# Patient Record
Sex: Male | Born: 1965 | Race: Black or African American | Hispanic: No | Marital: Married | Smoking: Never smoker
Health system: Southern US, Community
[De-identification: ages and names within clinical notes are randomized; demographics above are authoritative.]

## PROBLEM LIST (undated history)

## (undated) DIAGNOSIS — E119 Type 2 diabetes mellitus without complications: Secondary | ICD-10-CM

## (undated) DIAGNOSIS — M109 Gout, unspecified: Secondary | ICD-10-CM

## (undated) HISTORY — DX: Type 2 diabetes mellitus without complications: E11.9

## (undated) HISTORY — PX: OTHER SURGICAL HISTORY: SHX169

---

## 2005-07-26 ENCOUNTER — Emergency Department (HOSPITAL_COMMUNITY): Admission: EM | Admit: 2005-07-26 | Discharge: 2005-07-26 | Payer: Self-pay | Admitting: Emergency Medicine

## 2005-07-30 ENCOUNTER — Emergency Department (HOSPITAL_COMMUNITY): Admission: EM | Admit: 2005-07-30 | Discharge: 2005-07-30 | Payer: Self-pay | Admitting: Emergency Medicine

## 2005-08-02 ENCOUNTER — Emergency Department (HOSPITAL_COMMUNITY): Admission: EM | Admit: 2005-08-02 | Discharge: 2005-08-02 | Payer: Self-pay | Admitting: Emergency Medicine

## 2005-12-09 ENCOUNTER — Ambulatory Visit (HOSPITAL_COMMUNITY): Admission: RE | Admit: 2005-12-09 | Discharge: 2005-12-09 | Payer: Self-pay | Admitting: Internal Medicine

## 2007-07-18 ENCOUNTER — Ambulatory Visit (HOSPITAL_COMMUNITY): Admission: RE | Admit: 2007-07-18 | Discharge: 2007-07-18 | Payer: Self-pay | Admitting: Internal Medicine

## 2008-09-20 ENCOUNTER — Emergency Department (HOSPITAL_COMMUNITY): Admission: EM | Admit: 2008-09-20 | Discharge: 2008-09-20 | Payer: Self-pay | Admitting: Emergency Medicine

## 2009-04-10 ENCOUNTER — Ambulatory Visit (HOSPITAL_COMMUNITY): Admission: RE | Admit: 2009-04-10 | Discharge: 2009-04-10 | Payer: Self-pay | Admitting: Internal Medicine

## 2012-07-05 ENCOUNTER — Encounter (HOSPITAL_COMMUNITY): Payer: Self-pay | Admitting: *Deleted

## 2012-07-05 ENCOUNTER — Emergency Department (HOSPITAL_COMMUNITY)
Admission: EM | Admit: 2012-07-05 | Discharge: 2012-07-06 | Disposition: A | Discharge status: Home / Self Care | Payer: PRIVATE HEALTH INSURANCE | Attending: Emergency Medicine | Admitting: Emergency Medicine

## 2012-07-05 DIAGNOSIS — IMO0002 Reserved for concepts with insufficient information to code with codable children: Secondary | ICD-10-CM | POA: Insufficient documentation

## 2012-07-05 DIAGNOSIS — Z791 Long term (current) use of non-steroidal anti-inflammatories (NSAID): Secondary | ICD-10-CM | POA: Insufficient documentation

## 2012-07-05 DIAGNOSIS — S79929A Unspecified injury of unspecified thigh, initial encounter: Secondary | ICD-10-CM | POA: Insufficient documentation

## 2012-07-05 DIAGNOSIS — Y92410 Unspecified street and highway as the place of occurrence of the external cause: Secondary | ICD-10-CM | POA: Insufficient documentation

## 2012-07-05 DIAGNOSIS — Y9389 Activity, other specified: Secondary | ICD-10-CM | POA: Insufficient documentation

## 2012-07-05 DIAGNOSIS — S79919A Unspecified injury of unspecified hip, initial encounter: Secondary | ICD-10-CM | POA: Insufficient documentation

## 2012-07-05 DIAGNOSIS — S46909A Unspecified injury of unspecified muscle, fascia and tendon at shoulder and upper arm level, unspecified arm, initial encounter: Secondary | ICD-10-CM | POA: Insufficient documentation

## 2012-07-05 DIAGNOSIS — S0993XA Unspecified injury of face, initial encounter: Secondary | ICD-10-CM | POA: Insufficient documentation

## 2012-07-05 DIAGNOSIS — S4980XA Other specified injuries of shoulder and upper arm, unspecified arm, initial encounter: Secondary | ICD-10-CM | POA: Insufficient documentation

## 2012-07-05 MED ORDER — CYCLOBENZAPRINE HCL 10 MG PO TABS
10.0000 mg | ORAL_TABLET | Freq: Once | ORAL | Status: AC
  Administered 2012-07-05: 10 mg via ORAL
  Filled 2012-07-05: qty 1

## 2012-07-05 MED ORDER — IBUPROFEN 800 MG PO TABS
800.0000 mg | ORAL_TABLET | Freq: Once | ORAL | Status: AC
  Administered 2012-07-05: 800 mg via ORAL
  Filled 2012-07-05: qty 1
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
## 2012-07-06 MED ORDER — CYCLOBENZAPRINE HCL 10 MG PO TABS: 10.0000 mg | ORAL_TABLET | Freq: Two times a day (BID) | ORAL | Status: DC | PRN

## 2012-07-06 MED ORDER — IBUPROFEN 800 MG PO TABS: 800.0000 mg | ORAL_TABLET | Freq: Three times a day (TID) | ORAL | Status: DC

## 2012-09-18 ENCOUNTER — Emergency Department (HOSPITAL_COMMUNITY)
Admission: EM | Admit: 2012-09-18 | Discharge: 2012-09-18 | Disposition: A | Discharge status: Home / Self Care | Payer: PRIVATE HEALTH INSURANCE | Attending: Emergency Medicine | Admitting: Emergency Medicine

## 2012-09-18 ENCOUNTER — Encounter (HOSPITAL_COMMUNITY): Payer: Self-pay | Admitting: *Deleted

## 2012-09-18 DIAGNOSIS — R05 Cough: Secondary | ICD-10-CM

## 2012-09-18 DIAGNOSIS — R059 Cough, unspecified: Secondary | ICD-10-CM | POA: Insufficient documentation

## 2012-09-18 DIAGNOSIS — Z8639 Personal history of other endocrine, nutritional and metabolic disease: Secondary | ICD-10-CM | POA: Insufficient documentation

## 2012-09-18 DIAGNOSIS — Z79899 Other long term (current) drug therapy: Secondary | ICD-10-CM | POA: Insufficient documentation

## 2012-09-18 DIAGNOSIS — Z862 Personal history of diseases of the blood and blood-forming organs and certain disorders involving the immune mechanism: Secondary | ICD-10-CM | POA: Insufficient documentation

## 2012-09-18 DIAGNOSIS — R0602 Shortness of breath: Secondary | ICD-10-CM | POA: Insufficient documentation

## 2012-09-18 DIAGNOSIS — J029 Acute pharyngitis, unspecified: Secondary | ICD-10-CM | POA: Insufficient documentation

## 2012-09-18 DIAGNOSIS — J069 Acute upper respiratory infection, unspecified: Secondary | ICD-10-CM | POA: Insufficient documentation

## 2012-09-18 HISTORY — DX: Gout, unspecified: M10.9

## 2012-09-18 MED ORDER — HYDROCOD POLST-CHLORPHEN POLST 10-8 MG/5ML PO LQCR: 5.0000 mL | Freq: Two times a day (BID) | ORAL | Status: DC

## 2012-09-18 MED ORDER — LORATADINE 10 MG PO TABS: 10.0000 mg | ORAL_TABLET | Freq: Every day | ORAL | Status: DC

## 2012-09-18 NOTE — ED Notes (Signed)
Pt states non productive cough, sneezing, runny nose, body aches since Thursday. Pt has taken Zpak and mucinex. Denies fever.

## 2012-09-18 NOTE — ED Provider Notes (Signed)
History  This chart was scribed for Joya Gaskins, MD by Ardeen Jourdain, ED Scribe. This patient was seen in room APA06/APA06 and the patient's care was started at 0810.  CSN: [id number]  Arrival date & time 09/18/12  0806   First MD Initiated Contact with Patient 09/18/12 [phone]      Chief Complaint  Patient presents with  . Cough    Patient is a 47 y.o. male presenting with cough. The history is provided by the patient. No language interpreter was used.  Cough This is a new problem. The current episode started more than 2 days ago. The problem occurs every few minutes. The problem has been gradually worsening. The cough is non-productive. There has been no fever. Associated symptoms include rhinorrhea, sore throat and shortness of breath. Pertinent negatives include no chest pain, no chills, no ear pain and no headaches. He has tried decongestants and cough syrup for the symptoms. He is not a smoker.     Jackson Stewart is a 47 y.o. male who presents to the Emergency Department complaining of cough that began Thursday. He states he has taken Azithromycin and Mucinex with no relief. He locates the congestion to his face and sinuses. He states the congestion is draining.    Past Medical History  Diagnosis Date  . Gout     History reviewed. No pertinent past surgical history.  No family history on file.  History  Substance Use Topics  . Smoking status: Never Smoker   . Smokeless tobacco: Not on file  . Alcohol Use: No      Review of Systems  Constitutional: Negative for fever and chills.  HENT: Positive for sore throat and rhinorrhea. Negative for ear pain.   Respiratory: Positive for cough and shortness of breath.   Cardiovascular: Negative for chest pain.  Gastrointestinal: Negative for nausea, vomiting, abdominal pain and diarrhea.  Neurological: Negative for headaches.  All other systems reviewed and are negative.    Allergies  Review of patient's allergies  indicates no known allergies.  Home Medications   Current Outpatient Rx  Name  Route  Sig  Dispense  Refill  . ALLOPURINOL 300 MG PO TABS   Oral   Take 150 mg by mouth daily.         . CYCLOBENZAPRINE HCL 10 MG PO TABS   Oral   Take 1 tablet (10 mg total) by mouth 2 (two) times daily as needed for muscle spasms.   20 tablet   0   . IBUPROFEN 200 MG PO TABS   Oral   Take 800 mg by mouth daily as needed. For occasional pain         . IBUPROFEN 800 MG PO TABS   Oral   Take 1 tablet (800 mg total) by mouth 3 (three) times daily.   21 tablet   0     Triage Vitals: BP 129/78  Pulse 96  Temp 98.2 F (36.8 C) (Oral)  Resp 16  SpO2 97%  Physical Exam  CONSTITUTIONAL: Well developed/well nourished HEAD AND FACE: Normocephalic/atraumatic EYES: EOMI/PERRL ENMT: Mucous membranes moist, nasal congestion, bilateral TM normal NECK: supple no meningeal signs SPINE:entire spine nontender CV: S1/S2 noted, no murmurs/rubs/gallops noted LUNGS: Lungs are clear to auscultation bilaterally, no apparent distress ABDOMEN: soft, nontender, no rebound or guarding GU:no cva tenderness NEURO: Pt is awake/alert, moves all extremitiesx4 EXTREMITIES: pulses normal, full ROM SKIN: warm, color normal PSYCH: no abnormalities of mood noted   ED Course  Procedures   DIAGNOSTIC STUDIES: Oxygen Saturation is 97% on room air, normal by my interpretation.    COORDINATION OF CARE:  8:19 AM: Discussed treatment plan which includes fluids and rest with pt at bedside and pt agreed to plan.   Pt seen walking around the ED in no distress.  Vitals appropriate.  Given meds for symptomatic relief.  Suspect viral process at this time     MDM  Nursing notes including past medical history and social history reviewed and considered in documentation       I personally performed the services described in this documentation, which was scribed in my presence. The recorded information has been  reviewed and is accurate.      Joya Gaskins, MD 09/18/12 [phone]

## 2013-08-03 ENCOUNTER — Emergency Department (HOSPITAL_COMMUNITY)
Admission: EM | Admit: 2013-08-03 | Discharge: 2013-08-03 | Disposition: A | Discharge status: Home / Self Care | Payer: PRIVATE HEALTH INSURANCE | Attending: Emergency Medicine | Admitting: Emergency Medicine

## 2013-08-03 ENCOUNTER — Encounter (HOSPITAL_COMMUNITY): Payer: Self-pay | Admitting: Emergency Medicine

## 2013-08-03 DIAGNOSIS — Z791 Long term (current) use of non-steroidal anti-inflammatories (NSAID): Secondary | ICD-10-CM | POA: Insufficient documentation

## 2013-08-03 DIAGNOSIS — Z862 Personal history of diseases of the blood and blood-forming organs and certain disorders involving the immune mechanism: Secondary | ICD-10-CM | POA: Insufficient documentation

## 2013-08-03 DIAGNOSIS — L259 Unspecified contact dermatitis, unspecified cause: Secondary | ICD-10-CM | POA: Insufficient documentation

## 2013-08-03 DIAGNOSIS — Z8639 Personal history of other endocrine, nutritional and metabolic disease: Secondary | ICD-10-CM | POA: Insufficient documentation

## 2013-08-03 DIAGNOSIS — B372 Candidiasis of skin and nail: Secondary | ICD-10-CM

## 2013-08-03 MED ORDER — FLUCONAZOLE 150 MG PO TABS: 150.0000 mg | ORAL_TABLET | Freq: Once | ORAL | Status: DC

## 2013-08-03 NOTE — ED Notes (Signed)
Patient states he started having rash in the crease of his legs.   Patient states his skin is "open".   Patient states "is raw".   Patient states went to dermatologist on Tuesday and was given a couple of medications, but claims they are not working.  Patient complains of smell to the rash.  Patient states he has now started breaking out under the arms with blisters.

## 2013-08-03 NOTE — ED Provider Notes (Signed)
CSN: [id number]     Arrival date & time 08/03/13  1324 History  This chart was scribed for non-physician practitioner Sharilyn Sites, PA-C working with Flint Melter, MD by Danella Maiers, ED Scribe. This patient was seen in room TR10C/TR10C and the patient's care was started at 2:05 PM.   Chief Complaint  Patient presents with  . Rash   The history is provided by the patient. No language interpreter was used.   HPI Comments: Jackson Stewart is a 47 y.o. male who presents to the Emergency Department complaining of foul-smelling white rash in the crease of bilateral legs. He also reports a similar rash to bilateral axillas but thinks there might be blisters appearing now. Pt was seen by dematologist three days ago and given small dose diflucan to use every other day (total of 4 doses) and topical ointment but claims it is not helping. He reports the skin affected by rash now feels sore and "raw".  Denies any drainage from the area.  No fevers, sweats, or chills.   Past Medical History  Diagnosis Date  . Gout    No past surgical history on file. No family history on file. History  Substance Use Topics  . Smoking status: Never Smoker   . Smokeless tobacco: Not on file  . Alcohol Use: No    Review of Systems  Skin: Positive for rash.  All other systems reviewed and are negative.    Allergies  Review of patient's allergies indicates no known allergies.  Home Medications   Current Outpatient Rx  Name  Route  Sig  Dispense  Refill  . ibuprofen (ADVIL,MOTRIN) 800 MG tablet   Oral   Take 1 tablet (800 mg total) by mouth 3 (three) times daily.   21 tablet   0    BP 141/89  Pulse 98  Temp(Src) 97.4 F (36.3 C) (Oral)  Resp 20  Ht 5\' 7"  (1.702 m)  Wt 216 lb (97.977 kg)  BMI 33.82 kg/m2  SpO2 99% Physical Exam  Nursing note and vitals reviewed. Constitutional: He is oriented to person, place, and time. He appears well-developed and well-nourished. No distress.  HENT:   Head: Normocephalic and atraumatic.  Eyes: EOM are normal.  Neck: Neck supple. No tracheal deviation present.  Cardiovascular: Normal rate.   Pulmonary/Chest: Effort normal. No respiratory distress.  Genitourinary:  Candidal rash noted in groin skin folds; skin appears irritated and cracked; no extension to penis or testicles; no drainage; no surrounding erythema or induration; no superimposed infection or cellulitis present  Musculoskeletal: Normal range of motion.  Neurological: He is alert and oriented to person, place, and time.  Skin: Skin is warm and dry.  No blisters noted under either axilla; small skin tag under left axilla  Psychiatric: He has a normal mood and affect. His behavior is normal.    ED Course  Procedures (including critical care time) Medications - No data to display  DIAGNOSTIC STUDIES: Oxygen Saturation is 99% on RA, normal by my interpretation.    COORDINATION OF CARE: 2:14 PM- Discussed treatment plan with pt which includes discharge home with prescription for diflucan. Pt agrees to plan.   MDM   1. Yeast dermatitis     Candidal yeast infection of groin without superimposed infection.  Will increase diflucan to daily use for 1 week.  Continue using topical ointment.  FU with PCP if problems occur.  Discussed plan with pt, they agreed.  Return precautions advised.  I personally performed the  services described in this documentation, which was scribed in my presence. The recorded information has been reviewed and is accurate.  Garlon Hatchet, PA-C 08/03/13 [phone]

## 2013-08-03 NOTE — ED Provider Notes (Signed)
Medical screening examination/treatment/procedure(s) were performed by non-physician practitioner and as supervising physician I was immediately available for consultation/collaboration.  Flint Melter, MD 08/03/13 [phone]

## 2017-04-18 ENCOUNTER — Emergency Department (HOSPITAL_COMMUNITY)
Admission: EM | Admit: 2017-04-18 | Discharge: 2017-04-18 | Disposition: A | Discharge status: Home / Self Care | Payer: PRIVATE HEALTH INSURANCE | Attending: Emergency Medicine | Admitting: Emergency Medicine

## 2017-04-18 ENCOUNTER — Encounter (HOSPITAL_COMMUNITY): Payer: Self-pay

## 2017-04-18 DIAGNOSIS — Z79899 Other long term (current) drug therapy: Secondary | ICD-10-CM | POA: Insufficient documentation

## 2017-04-18 DIAGNOSIS — R21 Rash and other nonspecific skin eruption: Secondary | ICD-10-CM | POA: Diagnosis not present

## 2017-04-18 LAB — CBG MONITORING, ED: Glucose-Capillary: 202 mg/dL — ABNORMAL HIGH (ref 65–99)

## 2017-04-18 MED ORDER — CLOTRIMAZOLE-BETAMETHASONE 1-0.05 % EX CREA: TOPICAL_CREAM | CUTANEOUS | 0 refills | Status: DC

## 2017-04-18 NOTE — ED Triage Notes (Signed)
Exposure to possible poison oak since Friday. Reports of itching to bilateral legs, back, buttocks.

## 2017-04-18 NOTE — ED Provider Notes (Signed)
[facility] DEPT Provider Note   CSN: [id number] Arrival date & time: 04/18/17  1305     History   Chief Complaint Chief Complaint  Patient presents with  . Rash    HPI Jackson Stewart is a 51 y.o. male.  The history is provided by the patient. No language interpreter was used.  Rash   This is a new problem. The problem has not changed since onset.The problem is associated with nothing. There has been no fever. The rash is present on the trunk, right lower leg and left lower leg. The pain is moderate. The pain has been constant since onset. He has tried nothing for the symptoms. The treatment provided no relief.    Past Medical History:  Diagnosis Date  . Gout     There are no active problems to display for this patient.   History reviewed. No pertinent surgical history.     Home Medications    Prior to Admission medications   Medication Sig Start Date End Date Taking? Authorizing Provider  colchicine 0.6 MG tablet Take 0.6 mg by mouth daily.    [provider]  fluconazole (DIFLUCAN) 150 MG tablet Take 1 tablet (150 mg total) by mouth once. 08/03/13   Larene Pickett, PA-C  ibuprofen (ADVIL,MOTRIN) 800 MG tablet Take 1 tablet (800 mg total) by mouth 3 (three) times daily. 07/06/12   Prentiss Bells, MD  indomethacin (INDOCIN) 25 MG capsule Take 25 mg by mouth 2 (two) times daily with a meal.    [provider]  PRESCRIPTION MEDICATION Apply 1 application topically 2 (two) times daily. Ketoconazole/ fluticasone cream. Compound cream 2:1 filled at [facility]    [provider]    Family History No family history on file.  Social History Social History  Substance Use Topics  . Smoking status: Never Smoker  . Smokeless tobacco: Never Used  . Alcohol use No     Allergies   Patient has no known allergies.   Review of Systems Review of Systems  Skin: Positive for rash.  All other systems reviewed and are negative.    Physical  Exam Updated Vital Signs BP 120/72   Pulse (!) 102   Temp 98.5 F (36.9 C) (Oral)   Resp 15   Ht 5\' 7"  (1.702 m)   Wt 99.8 kg (220 lb)   SpO2 99%   BMI 34.46 kg/m   Physical Exam  Constitutional: He is oriented to person, place, and time. He appears well-developed and well-nourished.  HENT:  Head: Normocephalic.  Cardiovascular: Normal rate.   Pulmonary/Chest: Effort normal.  Neurological: He is alert and oriented to person, place, and time.  Skin:  Rash chest and abdomen.  Red raised,  Worse in crease of abdomen and underarms.   Psychiatric: He has a normal mood and affect.  Nursing note and vitals reviewed.    ED Treatments / Results  Labs (all labs ordered are listed, but only abnormal results are displayed) Labs Reviewed  CBG MONITORING, ED - Abnormal; Notable for the following:       Result Value   Glucose-Capillary 202 (*)    All other components within normal limits    EKG  EKG Interpretation None       Radiology No results found.  Procedures Procedures (including critical care time)  Medications Ordered in ED Medications - No data to display   Initial Impression / Assessment and Plan / ED Course  I have reviewed the triage vital signs  and the nursing notes.  Pertinent labs & imaging results that were available during my care of the patient were reviewed by me and considered in my medical decision making (see chart for details).     Pt's glucose is 202.   I am suspicious for fungal infection.   I will treat with Lotrisone for inflammation and   Final Clinical Impressions(s) / ED Diagnoses   Final diagnoses:  Rash    New Prescriptions Discharge Medication List as of 04/18/2017  2:48 PM    START taking these medications   Details  clotrimazole-betamethasone (LOTRISONE) cream Apply to affected area 2 times daily prn, Print      An After Visit Summary was printed and given to the patient.    Fransico Meadow, [place] 04/18/17 1600      Margette Fast, MD 04/18/17 1759

## 2017-04-18 NOTE — Discharge Instructions (Signed)
Apply cream to affected areas.   See Dr. Legrand Rams for recheck in 2-3 days.   You need to have diabetes testing.  Your glucose was elevated today

## 2017-08-12 LAB — COLOGUARD

## 2017-09-23 ENCOUNTER — Other Ambulatory Visit: Payer: Self-pay | Admitting: Occupational Medicine

## 2017-09-23 ENCOUNTER — Ambulatory Visit: Payer: Self-pay

## 2017-09-23 DIAGNOSIS — M25572 Pain in left ankle and joints of left foot: Secondary | ICD-10-CM

## 2017-11-07 ENCOUNTER — Encounter: Payer: Self-pay | Admitting: Gastroenterology

## 2018-01-04 ENCOUNTER — Ambulatory Visit (INDEPENDENT_AMBULATORY_CARE_PROVIDER_SITE_OTHER): Payer: PRIVATE HEALTH INSURANCE | Admitting: Gastroenterology

## 2018-01-04 ENCOUNTER — Encounter: Payer: Self-pay | Admitting: Gastroenterology

## 2018-01-04 ENCOUNTER — Other Ambulatory Visit: Payer: Self-pay

## 2018-01-04 DIAGNOSIS — R195 Other fecal abnormalities: Secondary | ICD-10-CM

## 2018-01-04 MED ORDER — PEG 3350-KCL-NA BICARB-NACL 420 G PO SOLR: 4000.0000 mL | ORAL | 0 refills | Status: DC

## 2018-01-04 NOTE — Assessment & Plan Note (Signed)
52 year old gentleman with recent positive Cologuard test.  No prior colonoscopy.  No overt GI symptoms.  Plan for colonoscopy in the near future.  I have discussed the risks, alternatives, benefits with regards to but not limited to the risk of reaction to medication, bleeding, infection, perforation and the patient is agreeable to proceed. Written consent to be obtained.

## 2018-01-04 NOTE — Patient Instructions (Signed)
Colonoscopy as scheduled. See separate instructions.  
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
## 2018-01-05 ENCOUNTER — Other Ambulatory Visit: Payer: Self-pay

## 2018-01-05 DIAGNOSIS — R195 Other fecal abnormalities: Secondary | ICD-10-CM

## 2018-01-05 NOTE — Progress Notes (Signed)
cc'ed to pcp °

## 2018-01-05 NOTE — Patient Instructions (Signed)
Called [facility]. No PA needed for TCS. Ref# [id number]

## 2018-02-13 ENCOUNTER — Encounter (HOSPITAL_COMMUNITY): Payer: Self-pay | Admitting: *Deleted

## 2018-02-13 ENCOUNTER — Other Ambulatory Visit: Payer: Self-pay

## 2018-02-13 ENCOUNTER — Encounter (HOSPITAL_COMMUNITY)
Admission: RE | Disposition: A | Discharge status: Home / Self Care | Payer: Self-pay | Source: Ambulatory Visit | Attending: Gastroenterology

## 2018-02-13 ENCOUNTER — Ambulatory Visit (HOSPITAL_COMMUNITY)
Admission: RE | Admit: 2018-02-13 | Discharge: 2018-02-13 | Disposition: A | Discharge status: Home / Self Care | Payer: PRIVATE HEALTH INSURANCE | Source: Ambulatory Visit | Attending: Gastroenterology | Admitting: Gastroenterology

## 2018-02-13 DIAGNOSIS — R195 Other fecal abnormalities: Secondary | ICD-10-CM | POA: Diagnosis present

## 2018-02-13 DIAGNOSIS — M109 Gout, unspecified: Secondary | ICD-10-CM | POA: Diagnosis not present

## 2018-02-13 DIAGNOSIS — D125 Benign neoplasm of sigmoid colon: Secondary | ICD-10-CM | POA: Diagnosis not present

## 2018-02-13 DIAGNOSIS — K635 Polyp of colon: Secondary | ICD-10-CM | POA: Diagnosis not present

## 2018-02-13 DIAGNOSIS — K5730 Diverticulosis of large intestine without perforation or abscess without bleeding: Secondary | ICD-10-CM | POA: Insufficient documentation

## 2018-02-13 DIAGNOSIS — K648 Other hemorrhoids: Secondary | ICD-10-CM | POA: Diagnosis not present

## 2018-02-13 DIAGNOSIS — Q438 Other specified congenital malformations of intestine: Secondary | ICD-10-CM | POA: Diagnosis not present

## 2018-02-13 DIAGNOSIS — K644 Residual hemorrhoidal skin tags: Secondary | ICD-10-CM | POA: Diagnosis not present

## 2018-02-13 DIAGNOSIS — Z79899 Other long term (current) drug therapy: Secondary | ICD-10-CM | POA: Diagnosis not present

## 2018-02-13 HISTORY — PX: COLONOSCOPY: SHX5424

## 2018-02-13 HISTORY — PX: POLYPECTOMY: SHX5525

## 2018-02-13 SURGERY — COLONOSCOPY
Anesthesia: Moderate Sedation

## 2018-02-13 MED ORDER — MEPERIDINE HCL 100 MG/ML IJ SOLN
INTRAMUSCULAR | Status: AC
  Filled 2018-02-13: qty 2

## 2018-02-13 MED ORDER — SODIUM CHLORIDE 0.9 % IV SOLN
INTRAVENOUS | Status: DC
  Administered 2018-02-13: 10:00:00 via INTRAVENOUS

## 2018-02-13 MED ORDER — SIMETHICONE 40 MG/0.6ML PO SUSP
ORAL | Status: DC
Start: 2018-02-13 — End: 2018-02-13
  Filled 2018-02-13: qty 30

## 2018-02-13 MED ORDER — SIMETHICONE 40 MG/0.6ML PO SUSP
ORAL | Status: DC | PRN
  Administered 2018-02-13: 2.5 mL

## 2018-02-13 MED ORDER — MIDAZOLAM HCL 5 MG/5ML IJ SOLN
INTRAMUSCULAR | Status: DC | PRN
  Administered 2018-02-13 (×3): 2 mg via INTRAVENOUS

## 2018-02-13 MED ORDER — MEPERIDINE HCL 100 MG/ML IJ SOLN
INTRAMUSCULAR | Status: DC | PRN
  Administered 2018-02-13 (×3): 25 mg via INTRAVENOUS

## 2018-02-13 MED ORDER — MIDAZOLAM HCL 5 MG/5ML IJ SOLN
INTRAMUSCULAR | Status: AC
  Filled 2018-02-13: qty 10
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
## 2018-02-13 NOTE — Op Note (Signed)
[facility] Patient Name: Jackson Stewart Procedure Date: 02/13/2018 10:36 AM MRN: [id number] Date of Birth: [birth year] Attending MD: Barney Drain MD, MD CSN: [id number] Age: 52 Admit Type: Outpatient Procedure:                Colonoscopy with SNARE CAUTERY POLYPECTOMY Indications:              Positive Cologuard test Providers:                Barney Drain MD, MD, Lurline Del, RN, Nelma Rothman,                            Technician Referring MD:             Rosita Fire MD, MD Medicines:                Meperidine 75 mg IV, Midazolam 6 mg IV Complications:            No immediate complications. Estimated Blood Loss:     Estimated blood loss: none. Procedure:                Pre-Anesthesia Assessment:                           - Prior to the procedure, a History and Physical                            was performed, and patient medications and                            allergies were reviewed. The patient's tolerance of                            previous anesthesia was also reviewed. The risks                            and benefits of the procedure and the sedation                            options and risks were discussed with the patient.                            All questions were answered, and informed consent                            was obtained. Prior Anticoagulants: The patient has                            taken no previous anticoagulant or antiplatelet                            agents. ASA Grade Assessment: II - A patient with                            mild systemic disease. After reviewing the risks  and benefits, the patient was deemed in                            satisfactory condition to undergo the procedure.                            After obtaining informed consent, the colonoscope                            was passed under direct vision. Throughout the                            procedure, the patient's blood pressure, pulse, and                             oxygen saturations were monitored continuously. The                            EC38-[id number]) scope was introduced through                            the anus and advanced to the the cecum, identified                            by appendiceal orifice and ileocecal valve. The                            colonoscopy was somewhat difficult due to a                            redundant colon. Successful completion of the                            procedure was aided by straightening and shortening                            the scope to obtain bowel loop reduction and                            COLOWRAP. The patient tolerated the procedure                            fairly well. The quality of the bowel preparation                            was adequate to identify polyps 6 mm and larger in                            size. The ileocecal valve, appendiceal orifice, and                            rectum were photographed. Scope In: 10:55:28 AM Scope Out: 11:15:47 AM Scope Withdrawal Time: 0 hours 17 minutes 43 seconds  Total Procedure Duration: 0 hours 20 minutes 19 seconds  Findings:      A 6 mm polyp was found in the sigmoid colon. The polyp was pedunculated.       The polyp was removed with a hot snare. Resection and retrieval were       complete.      Multiple small and large-mouthed diverticula were found in the       recto-sigmoid colon, sigmoid colon and transverse colon.      The recto-sigmoid colon and sigmoid colon were moderately redundant.      External and internal hemorrhoids were found. The hemorrhoids were       moderate. Impression:               - One 6 mm polyp in the sigmoid colon, removed with                            a hot snare. Resected and retrieved.                           - MODERATE Diverticulosis in the recto-sigmoid                            colon, in the sigmoid colon and in the transverse                            colon.                            - Redundant LEFT colon.                           - External and internal hemorrhoids. Moderate Sedation:      Moderate (conscious) sedation was administered by the endoscopy nurse       and supervised by the endoscopist. The following parameters were       monitored: oxygen saturation, heart rate, blood pressure, and response       to care. Total physician intraservice time was 39 minutes. Recommendation:           - Repeat colonoscopy date to be determined after                            pending pathology results are reviewed for                            surveillance based on pathology results. CONSIDER                            TCS WITHIN NEXT 6 MOS DUE TO OILY RESIDUE CREATING                            FILM ON SCOPE LENS AND LIMITING VISUALIZATION.                           - High fiber diet.                           -  Continue present medications.                           - Await pathology results.                           - Patient has a contact number available for                            emergencies. The signs and symptoms of potential                            delayed complications were discussed with the                            patient. Return to normal activities tomorrow.                            Written discharge instructions were provided to the                            patient. Procedure Code(s):        --- Professional ---                           [phone], Colonoscopy, flexible; with removal of                            tumor(s), polyp(s), or other lesion(s) by snare                            technique                           G0500, Moderate sedation services provided by the                            same physician or other qualified health care                            professional performing a gastrointestinal                            endoscopic service that sedation supports,                            requiring the  presence of an independent trained                            observer to assist in the monitoring of the                            patient's level of consciousness and physiological                            status; initial 15 minutes of  intra-service time;                            patient age 37 years or older (additional time may                            be reported with [phone], as appropriate)                           [phone], Moderate sedation services provided by the                            same physician or other qualified health care                            professional performing the diagnostic or                            therapeutic service that the sedation supports,                            requiring the presence of an independent trained                            observer to assist in the monitoring of the                            patient's level of consciousness and physiological                            status; each additional 15 minutes intraservice                            time (List separately in addition to code for                            primary service)                           [phone], Moderate sedation services provided by the                            same physician or other qualified health care                            professional performing the diagnostic or                            therapeutic service that the sedation supports,                            requiring the presence of an independent trained                            observer to assist in the  monitoring of the                            patient's level of consciousness and physiological                            status; each additional 15 minutes intraservice                            time (List separately in addition to code for                            primary service) Diagnosis Code(s):        --- Professional ---                           D12.5, Benign neoplasm  of sigmoid colon                           K64.8, Other hemorrhoids                           R19.5, Other fecal abnormalities                           K57.30, Diverticulosis of large intestine without                            perforation or abscess without bleeding                           Q43.8, Other specified congenital malformations of                            intestine CPT copyright 2017 [employer]. All rights reserved. The codes documented in this report are preliminary and upon coder review may  be revised to meet current compliance requirements. Barney Drain, MD Barney Drain MD, MD 02/13/2018 11:34:28 AM This report has been signed electronically. Number of Addenda: 0

## 2018-02-13 NOTE — Progress Notes (Signed)
Please excuse Jackson Stewart from work today and tomorrow 02/13/2018 and 02/14/2018 due to sedation today.  He is unable to drive for 24 hours.     Rosalyn Gess RN

## 2018-02-13 NOTE — Discharge Instructions (Signed)
You have small internal hemorrhoids and diverticulosis IN YOUR LEFT AND RIGHT COLON. YOU HAD ONE SMALL POLYP REMOVED.     DRINK WATER TO KEEP YOUR URINE LIGHT YELLOW.  CONTINUE YOUR WEIGHT LOSS EFFORTS. YOUR BODY MASS INDEX IS OVER 30 WHICH MEANS YOU ARE OBESE. OBESITY IS ASSOCIATED WITH AN INCREASE FOR ALL CANCERS, INCLUDING ESOPHAGEAL AND COLON CANCER. A WEIGHT OF 175 lbs or less  WILL GET YOUR BODY MASS INDEX(BMI) UNDER 30.  FOLLOW A HIGH FIBER DIET. AVOID ITEMS THAT CAUSE BLOATING. See info below.   YOUR BIOPSY RESULTS WILL BE AVAILABLE IN 7 days.  USE PREPARATION H FOUR TIMES  A DAY IF NEEDED TO RELIEVE RECTAL PAIN/PRESSURE/BLEEDING.   Next colonoscopy in 6 mos due to limited view of the colon lining. It was obscured by a oily film on the lens of the scope.  Colonoscopy Care After Read the instructions outlined below and refer to this sheet in the next week. These discharge instructions provide you with general information on caring for yourself after you leave the hospital. While your treatment has been planned according to the most current medical practices available, unavoidable complications occasionally occur. If you have any problems or questions after discharge, call DR. Juneau Doughman, [phone].  ACTIVITY  You may resume your regular activity, but move at a slower pace for the next 24 hours.   Take frequent rest periods for the next 24 hours.   Walking will help get rid of the air and reduce the bloated feeling in your belly (abdomen).   No driving for 24 hours (because of the medicine (anesthesia) used during the test).   You may shower.   Do not sign any important legal documents or operate any machinery for 24 hours (because of the anesthesia used during the test).    NUTRITION  Drink plenty of fluids.   You may resume your normal diet as instructed by your doctor.   Begin with a light meal and progress to your normal diet. Heavy or fried foods are harder to  digest and may make you feel sick to your stomach (nauseated).   Avoid alcoholic beverages for 24 hours or as instructed.    MEDICATIONS  You may resume your normal medications.   WHAT YOU CAN EXPECT TODAY  Some feelings of bloating in the abdomen.   Passage of more gas than usual.   Spotting of blood in your stool or on the toilet paper  .  IF YOU HAD POLYPS REMOVED DURING THE COLONOSCOPY:  Eat a soft diet IF YOU HAVE NAUSEA, BLOATING, ABDOMINAL PAIN, OR VOMITING.    FINDING OUT THE RESULTS OF YOUR TEST Not all test results are available during your visit. DR. Oneida Alar WILL CALL YOU WITHIN 14 DAYS OF YOUR PROCEDUE WITH YOUR RESULTS. Do not assume everything is normal if you have not heard from DR. Marguerite Jarboe, CALL HER OFFICE AT [phone].  SEEK IMMEDIATE MEDICAL ATTENTION AND CALL THE OFFICE: [phone] IF:  You have more than a spotting of blood in your stool.   Your belly is swollen (abdominal distention).   You are nauseated or vomiting.   You have a temperature over 101F.   You have abdominal pain or discomfort that is severe or gets worse throughout the day.  High-Fiber Diet A high-fiber diet changes your normal diet to include more whole grains, legumes, fruits, and vegetables. Changes in the diet involve replacing refined carbohydrates with unrefined foods. The calorie level of the diet is essentially unchanged. The  Dietary Reference Intake (recommended amount) for adult males is 38 grams per day. For adult females, it is 25 grams per day. Pregnant and lactating women should consume 28 grams of fiber per day. Fiber is the intact part of a plant that is not broken down during digestion. Functional fiber is fiber that has been isolated from the plant to provide a beneficial effect in the body. PURPOSE  Increase stool bulk.   Ease and regulate bowel movements.   Lower cholesterol.   REDUCE RISK OF COLON CANCER  INDICATIONS THAT YOU NEED MORE  FIBER  Constipation and hemorrhoids.   Uncomplicated diverticulosis (intestine condition) and irritable bowel syndrome.   Weight management.   As a protective measure against hardening of the arteries (atherosclerosis), diabetes, and cancer.   GUIDELINES FOR INCREASING FIBER IN THE DIET  Start adding fiber to the diet slowly. A gradual increase of about 5 more grams (2 slices of whole-wheat bread, 2 servings of most fruits or vegetables, or 1 bowl of high-fiber cereal) per day is best. Too rapid an increase in fiber may result in constipation, flatulence, and bloating.   Drink enough water and fluids to keep your urine clear or pale yellow. Water, juice, or caffeine-free drinks are recommended. Not drinking enough fluid may cause constipation.   Eat a variety of high-fiber foods rather than one type of fiber.   Try to increase your intake of fiber through using high-fiber foods rather than fiber pills or supplements that contain small amounts of fiber.   The goal is to change the types of food eaten. Do not supplement your present diet with high-fiber foods, but replace foods in your present diet.   INCLUDE A VARIETY OF FIBER SOURCES  Replace refined and processed grains with whole grains, canned fruits with fresh fruits, and incorporate other fiber sources. White rice, white breads, and most bakery goods contain little or no fiber.   Brown whole-grain rice, buckwheat oats, and many fruits and vegetables are all good sources of fiber. These include: broccoli, Brussels sprouts, cabbage, cauliflower, beets, sweet potatoes, white potatoes (skin on), carrots, tomatoes, eggplant, squash, berries, fresh fruits, and dried fruits.   Cereals appear to be the richest source of fiber. Cereal fiber is found in whole grains and bran. Bran is the fiber-rich outer coat of cereal grain, which is largely removed in refining. In whole-grain cereals, the bran remains. In breakfast cereals, the largest  amount of fiber is found in those with "bran" in their names. The fiber content is sometimes indicated on the label.   You may need to include additional fruits and vegetables each day.   In baking, for 1 cup white flour, you may use the following substitutions:   1 cup whole-wheat flour minus 2 tablespoons.   1/2 cup white flour plus 1/2 cup whole-wheat flour.   Polyps, Colon  A polyp is extra tissue that grows inside your body. Colon polyps grow in the large intestine. The large intestine, also called the colon, is part of your digestive system. It is a long, hollow tube at the end of your digestive tract where your body makes and stores stool. Most polyps are not dangerous. They are benign. This means they are not cancerous. But over time, some types of polyps can turn into cancer. Polyps that are smaller than a pea are usually not harmful. But larger polyps could someday become or may already be cancerous. To be safe, doctors remove all polyps and test them.  PREVENTION There is not one sure way to prevent polyps. You might be able to lower your risk of getting them if you:  Eat more fruits and vegetables and less fatty food.   Do not smoke.   Avoid alcohol.   Exercise every day.   Lose weight if you are overweight.   Eating more calcium and folate can also lower your risk of getting polyps. Some foods that are rich in calcium are milk, cheese, and broccoli. Some foods that are rich in folate are chickpeas, kidney beans, and spinach.    Diverticulosis Diverticulosis is a common condition that develops when small pouches (diverticula) form in the wall of the colon. The risk of diverticulosis increases with age. It happens more often in people who eat a low-fiber diet. Most individuals with diverticulosis have no symptoms. Those individuals with symptoms usually experience belly (abdominal) pain, constipation, or loose stools (diarrhea).  HOME CARE INSTRUCTIONS  Increase the  amount of fiber in your diet as directed by your caregiver or dietician. This may reduce symptoms of diverticulosis.   Drink at least 6 to 8 glasses of water each day to prevent constipation.   Try not to strain when you have a bowel movement.   Avoiding nuts and seeds to prevent complications is NOT NECESSARY.   FOODS HAVING HIGH FIBER CONTENT INCLUDE:  Fruits. Apple, peach, pear, tangerine, raisins, prunes.   Vegetables. Brussels sprouts, asparagus, broccoli, cabbage, carrot, cauliflower, romaine lettuce, spinach, summer squash, tomato, winter squash, zucchini.   Starchy Vegetables. Baked beans, kidney beans, lima beans, split peas, lentils, potatoes (with skin).   Grains. Whole wheat bread, brown rice, bran flake cereal, plain oatmeal, white rice, shredded wheat, bran muffins.   SEEK IMMEDIATE MEDICAL CARE IF:  You develop increasing pain or severe bloating.   You have an oral temperature above 101F.   You develop vomiting or bowel movements that are bloody or black.   Hemorrhoids Hemorrhoids are dilated (enlarged) veins around the rectum. Sometimes clots will form in the veins. This makes them swollen and painful. These are called thrombosed hemorrhoids. Causes of hemorrhoids include:  Constipation.   Straining to have a bowel movement.   HEAVY LIFTING   HOME CARE INSTRUCTIONS  Eat a well balanced diet and drink 6 to 8 glasses of water every day to avoid constipation. You may also use a bulk laxative.   Avoid straining to have bowel movements.   Keep anal area dry and clean.   Do not use a donut shaped pillow or sit on the toilet for long periods. This increases blood pooling and pain.   Move your bowels when your body has the urge; this will require less straining and will decrease pain and pressure.

## 2018-02-17 ENCOUNTER — Telehealth: Payer: Self-pay | Admitting: Gastroenterology

## 2018-02-17 NOTE — Telephone Encounter (Signed)
Please call pt. HE had a BENIGN polypoid lesion removed. FOLLOW A High fiber diet.    DRINK WATER TO KEEP YOUR URINE LIGHT YELLOW.  CONTINUE YOUR WEIGHT LOSS EFFORTS. . A WEIGHT OF 175 lbs or less  WILL GET YOUR BODY MASS INDEX(BMI) UNDER 30.  FOLLOW A HIGH FIBER DIET. AVOID ITEMS THAT CAUSE BLOATING.   USE PREPARATION H FOUR TIMES  A DAY IF NEEDED TO RELIEVE RECTAL PAIN/PRESSURE/BLEEDING.   Next colonoscopy in 6 mos due to limited view of the colon lining. It was obscured by a oily film on the lens of the scope. DO NOT DRINK BROTH WITH OIL/FAT DROPLETS IN IT ON THE DAY BEFORE YOUR COLONOSCOPY.

## 2018-02-20 NOTE — Telephone Encounter (Signed)
Lmom, waiting on a return call.  

## 2018-02-20 NOTE — Telephone Encounter (Signed)
ON RECALL  °

## 2018-02-21 NOTE — Telephone Encounter (Signed)
PT is aware of results and to make sure he does not consume oily broths etc when doing his next prep.

## 2018-02-27 ENCOUNTER — Encounter (HOSPITAL_COMMUNITY): Payer: Self-pay | Admitting: Gastroenterology

## 2018-06-29 ENCOUNTER — Telehealth: Payer: Self-pay | Admitting: Gastroenterology

## 2018-06-29 NOTE — Telephone Encounter (Signed)
TRIAGE FOR TCS.

## 2018-06-29 NOTE — Telephone Encounter (Signed)
fowarding to Manuela Schwartz to schedule NV with Almyra Free

## 2018-06-29 NOTE — Telephone Encounter (Signed)
Dr. Oneida Alar, please advise if patient needs to be triaged as last OV was in May 2019. Thanks

## 2018-06-29 NOTE — Telephone Encounter (Signed)
ON RECALL FOR REPEAT TCS IN 6 MONTHS

## 2018-06-30 ENCOUNTER — Encounter: Payer: Self-pay | Admitting: Gastroenterology

## 2018-06-30 NOTE — Telephone Encounter (Signed)
NV made and letter mailed 

## 2018-07-24 ENCOUNTER — Telehealth: Payer: Self-pay

## 2018-07-24 ENCOUNTER — Encounter: Payer: Self-pay | Admitting: Gastroenterology

## 2018-07-24 ENCOUNTER — Ambulatory Visit: Payer: PRIVATE HEALTH INSURANCE

## 2018-07-24 NOTE — Telephone Encounter (Signed)
PATIENT WAS A NO SHOW AND LETTER SENT  °

## 2018-07-24 NOTE — Telephone Encounter (Signed)
noted 

## 2018-10-15 IMAGING — DX DG ANKLE COMPLETE 3+V*L*
3 series · 3 of 3 positions shown · non-contrast
Comparison: None.

CLINICAL DATA: Injury

EXAM:
LEFT ANKLE COMPLETE - 3+ VIEW

[ankle ap]
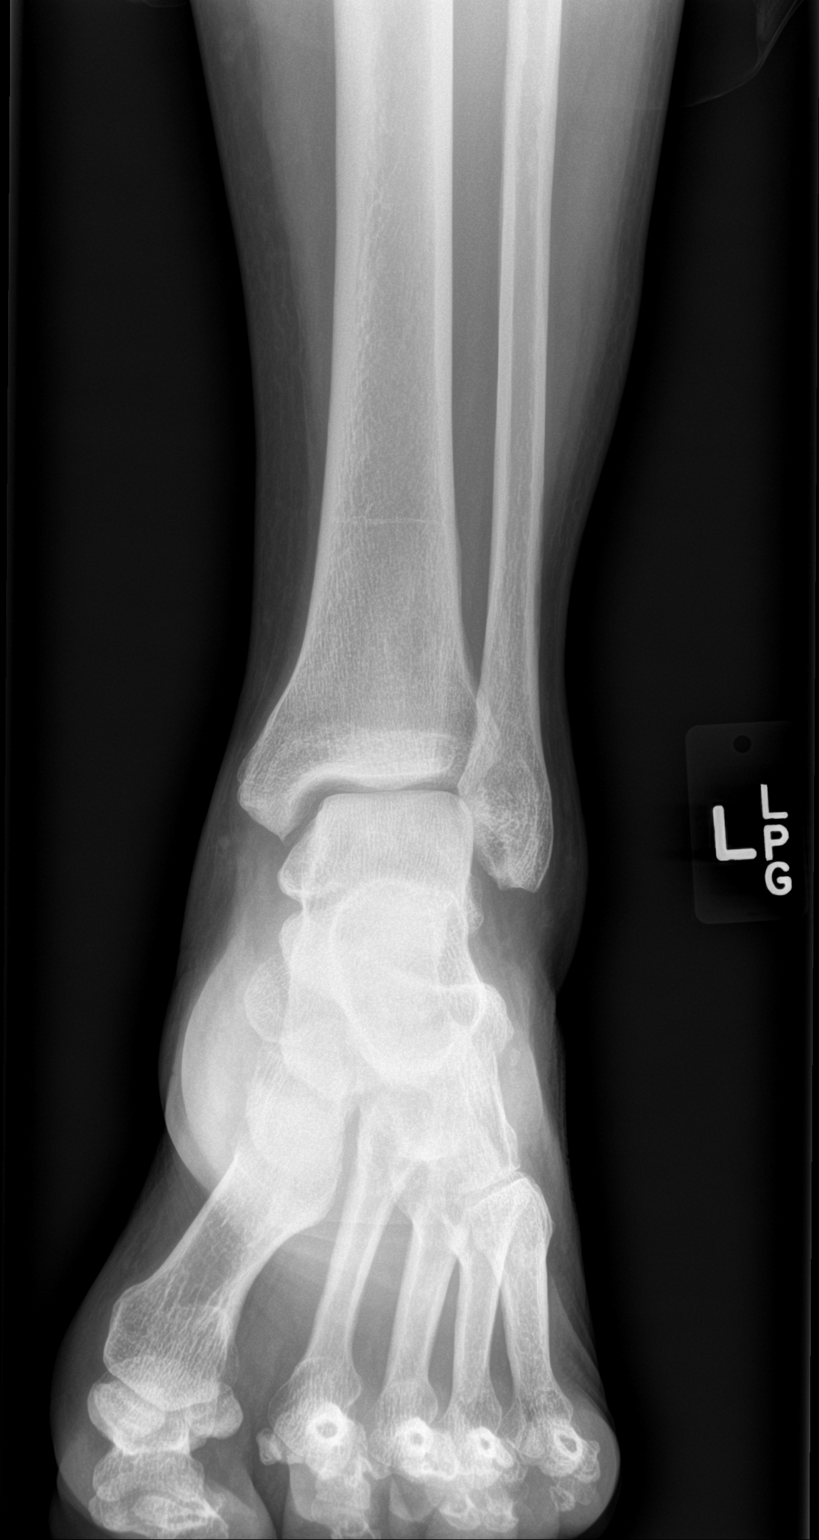

[ankle mortise]
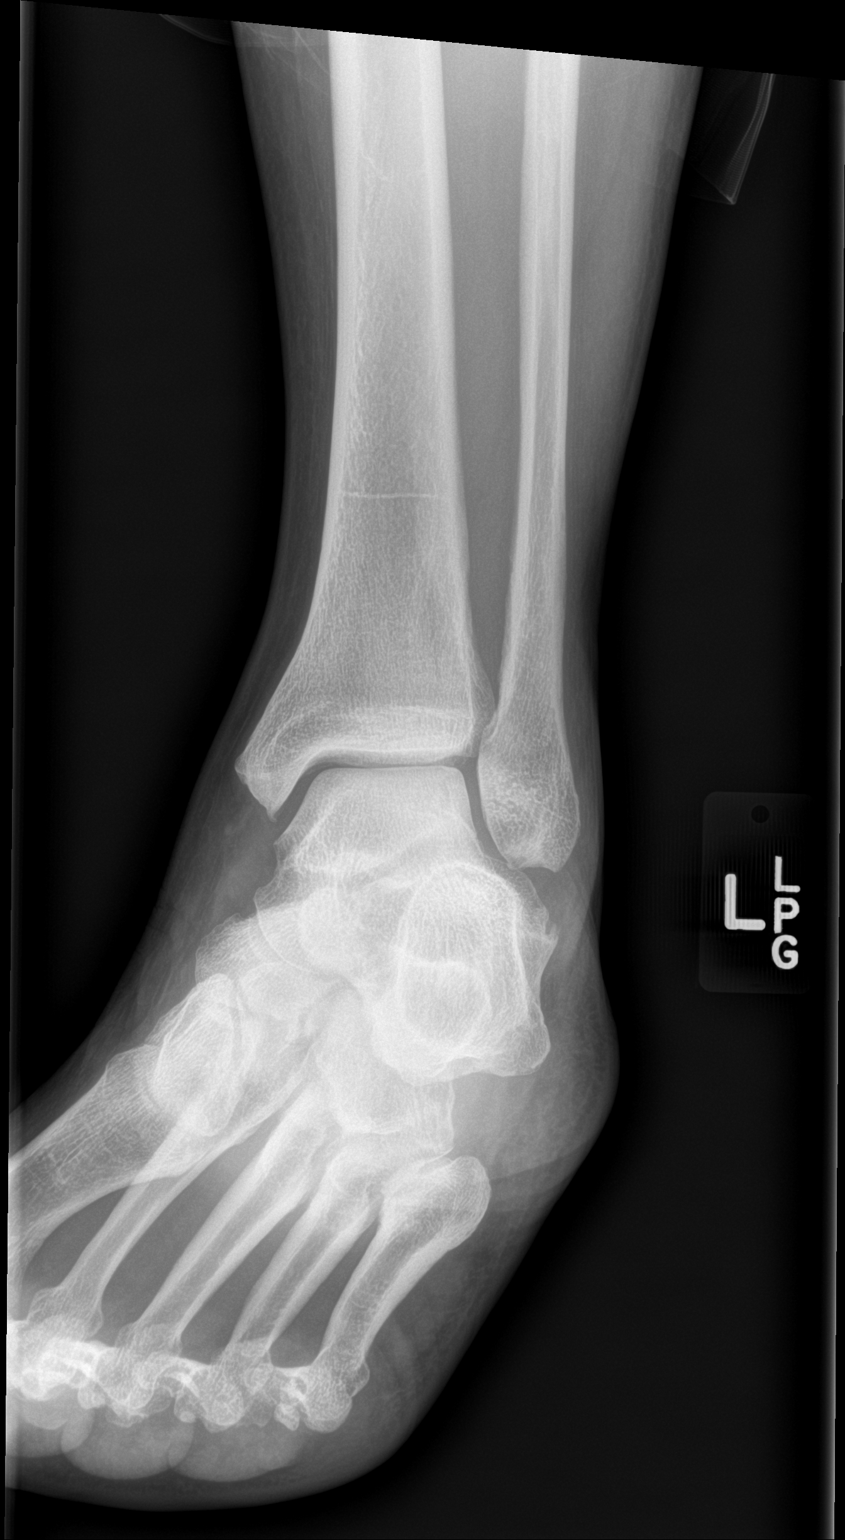

[ankle lat]
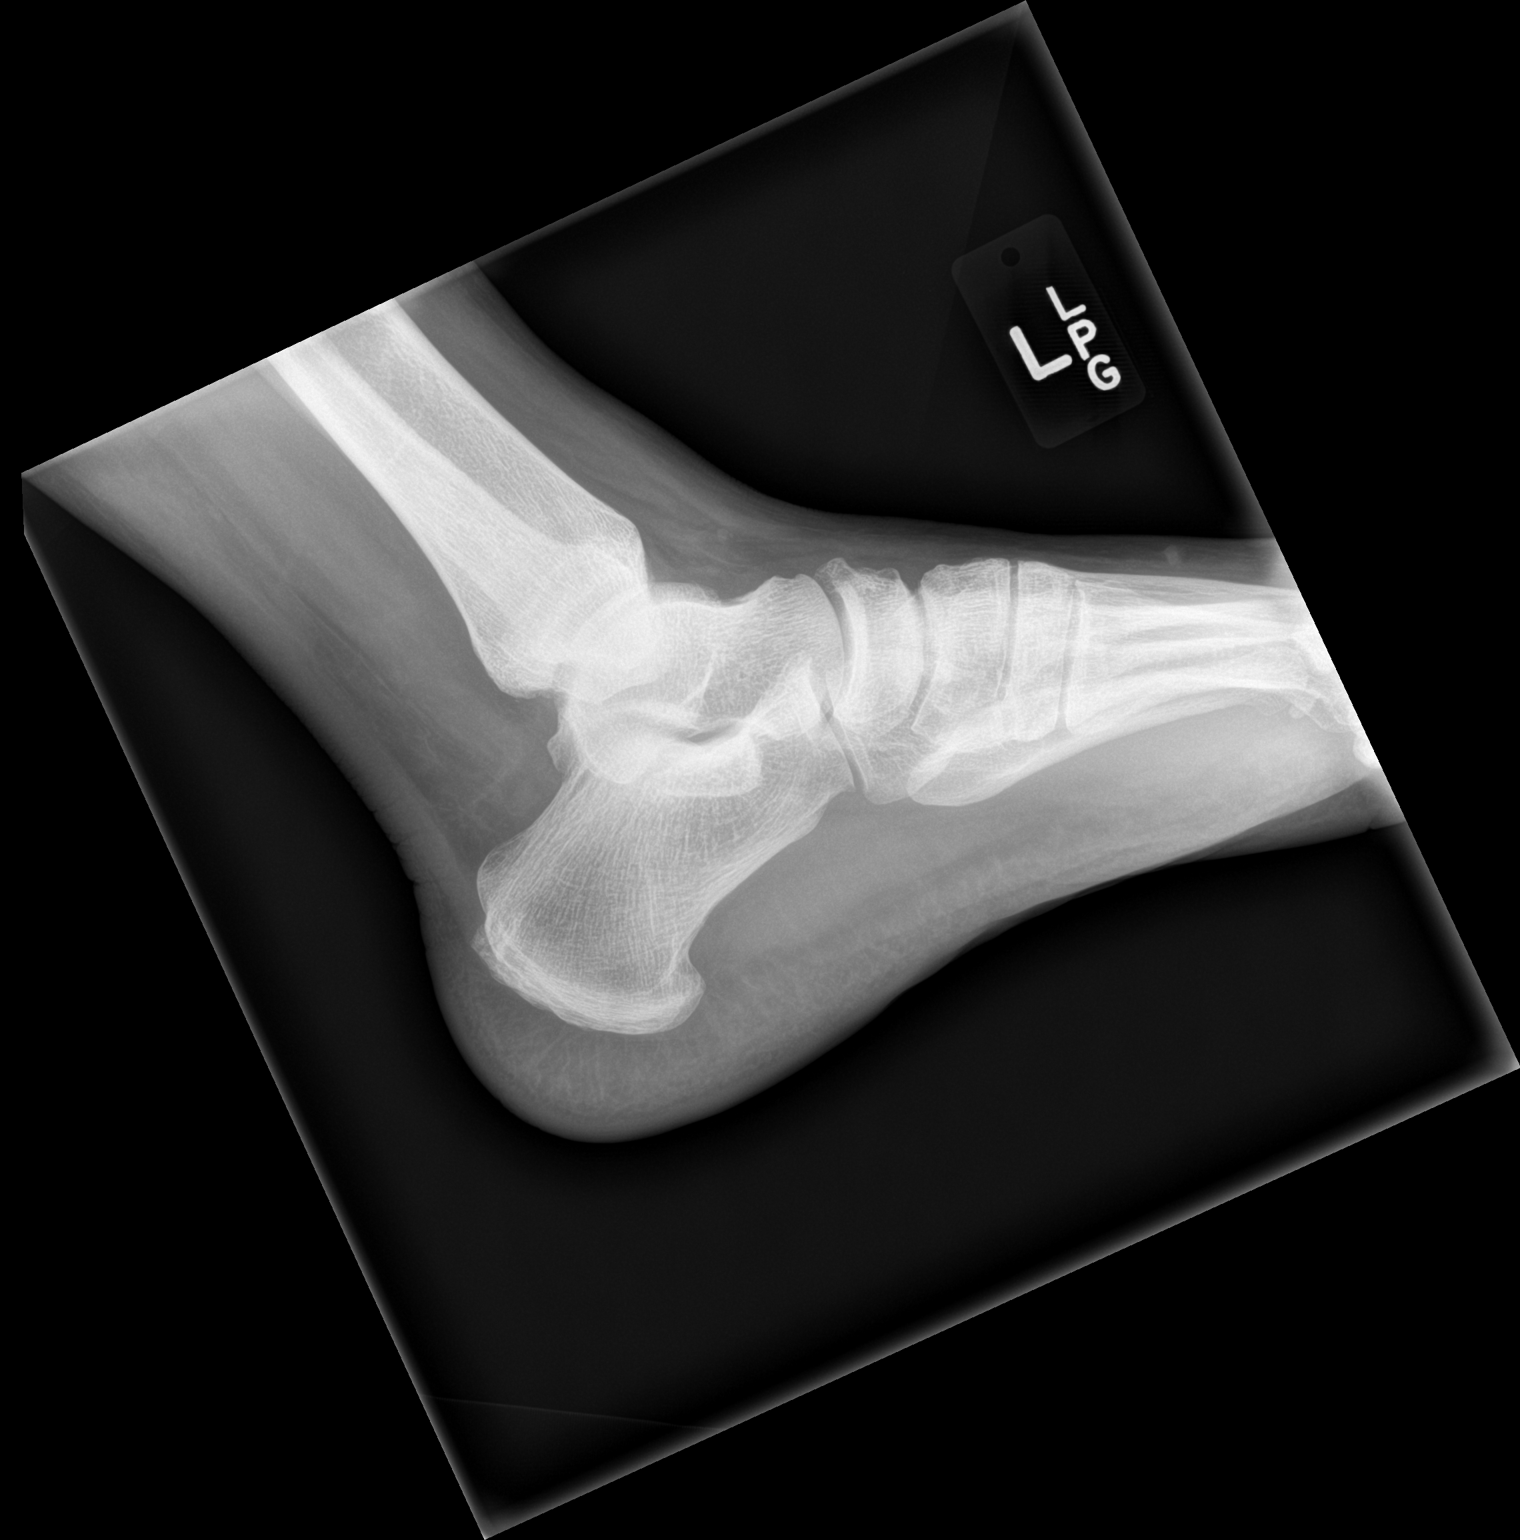

[3 of 3 positions shown; findings below may reference images not displayed]

FINDINGS: No acute fracture. No dislocation. Unremarkable soft tissues. Mild
degenerative change.
IMPRESSION: No acute bony pathology.

## 2019-11-10 ENCOUNTER — Ambulatory Visit: Payer: PRIVATE HEALTH INSURANCE | Attending: Internal Medicine

## 2019-11-10 ENCOUNTER — Other Ambulatory Visit: Payer: Self-pay

## 2019-11-10 DIAGNOSIS — Z23 Encounter for immunization: Secondary | ICD-10-CM | POA: Insufficient documentation

## 2019-11-10 NOTE — Progress Notes (Signed)
   Covid-19 Vaccination Clinic  Name:  [facility]    MRN: [id number] DOB: [birth year]  11/10/2019  Mr. [name] was observed post Covid-19 immunization for 15 minutes without incident. He was provided with Vaccine Information Sheet and instruction to access the V-Safe system.   Mr. [name] was instructed to call 911 with any severe reactions post vaccine: [name] Kitchen Difficulty breathing  . Swelling of face and throat  . A fast heartbeat  . A bad rash all over body  . Dizziness and weakness   Immunizations Administered    Name Date Dose VIS Date Route   Moderna COVID-19 Vaccine 11/10/2019  8:49 AM 0.5 mL 08/07/2019 Intramuscular   Manufacturer: Moderna   Lot: [id number]   [place]VO:[id number]

## 2019-12-12 ENCOUNTER — Ambulatory Visit: Payer: PRIVATE HEALTH INSURANCE | Attending: Internal Medicine

## 2019-12-12 DIAGNOSIS — Z23 Encounter for immunization: Secondary | ICD-10-CM

## 2019-12-12 NOTE — Progress Notes (Signed)
   Covid-19 Vaccination Clinic  Name:  [facility]    MRN: [id number] DOB: 04/21/1966  12/12/2019  Mr. Birchard was observed post Covid-19 immunization for 15 minutes without incident. He was provided with Vaccine Information Sheet and instruction to access the V-Safe system.   Mr. Lycans was instructed to call 911 with any severe reactions post vaccine: [name] Kitchen Difficulty breathing  . Swelling of face and throat  . A fast heartbeat  . A bad rash all over body  . Dizziness and weakness   Immunizations Administered    Name Date Dose VIS Date Route   Moderna COVID-19 Vaccine 12/12/2019  3:00 PM 0.5 mL 08/07/2019 Intramuscular   Manufacturer: Levan Hurst   [id number]   [place]:[id number]

## 2020-11-05 ENCOUNTER — Ambulatory Visit: Payer: Self-pay

## 2020-11-05 ENCOUNTER — Ambulatory Visit (INDEPENDENT_AMBULATORY_CARE_PROVIDER_SITE_OTHER): Payer: 59 | Admitting: Orthopaedic Surgery

## 2020-11-05 ENCOUNTER — Encounter: Payer: Self-pay | Admitting: Orthopaedic Surgery

## 2020-11-05 VITALS — Ht 65.0 in | Wt 221.0 lb

## 2020-11-05 DIAGNOSIS — M1611 Unilateral primary osteoarthritis, right hip: Secondary | ICD-10-CM

## 2020-11-05 DIAGNOSIS — M25551 Pain in right hip: Secondary | ICD-10-CM | POA: Diagnosis not present
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
## 2021-01-05 ENCOUNTER — Other Ambulatory Visit: Payer: Self-pay | Admitting: Physician Assistant

## 2021-01-15 NOTE — Pre-Procedure Instructions (Signed)
Surgical Instructions    Your procedure is scheduled on Tuesday, May 17th.  Report to [facility] Main Entrance "A" at 12:20 P.M., then check in with the Admitting office.  Call this number if you have problems the morning of surgery:  [phone]   If you have any questions prior to your surgery date call [phone]: Open Monday-Friday 8am-4pm    Remember:  Do not eat after midnight the night before your surgery  You may drink clear liquids until 11:20 a.m. the morning of your surgery.   Clear liquids allowed are: Water, Non-Citrus Juices (without pulp), Carbonated Beverages, Clear Tea, Black Coffee Only, and Gatorade.   Enhanced Recovery after Surgery for Orthopedics Enhanced Recovery after Surgery is a protocol used to improve the stress on your body and your recovery after surgery.  Patient Instructions  . The night before surgery:  o No food after midnight. ONLY clear liquids after midnight   . The day of surgery (if you do NOT have diabetes):  o Drink ONE (1) Pre-Surgery Clear Ensure by 11:20 am the morning of surgery   o This drink was given to you during your [facility]  pre-op appointment visit. o Nothing else to drink after completing the  Pre-Surgery Clear Ensure.         If you have questions, please contact your surgeon's office.     Take these medicines the morning of surgery with A SIP OF WATER  allopurinol (ZYLOPRIM)  Colchicine-as needed  As of today, STOP taking any Aspirin (unless otherwise instructed by your surgeon) Aleve, Naproxen, Ibuprofen, Motrin, Advil, Goody's, BC's, all herbal medications, fish oil, and all vitamins.                Do NOT Smoke (Tobacco/Vaping) or drink Alcohol 24 hours prior to your procedure.  If you use a CPAP at night, you may bring all equipment for your overnight stay.   Contacts, glasses, piercing's, hearing aid's, dentures or partials may not be worn into surgery, please bring cases for these belongings.    For  patients admitted to the [facility], discharge time will be determined by your treatment team.   Patients discharged the day of surgery will not be allowed to drive home, and someone needs to stay with them for 24 hours.    Special instructions:   [facility]- Preparing For Surgery  Before surgery, you can play an important role. Because skin is not sterile, your skin needs to be as free of germs as possible. You can reduce the number of germs on your skin by washing with CHG (chlorahexidine gluconate) Soap before surgery.  CHG is an antiseptic cleaner which kills germs and bonds with the skin to continue killing germs even after washing.    Oral Hygiene is also important to reduce your risk of infection.  Remember - BRUSH YOUR TEETH THE MORNING OF SURGERY WITH YOUR REGULAR TOOTHPASTE  Please do not use if you have an allergy to CHG or antibacterial soaps. If your skin becomes reddened/irritated stop using the CHG.  Do not shave (including legs and underarms) for at least 48 hours prior to first CHG shower. It is OK to shave your face.  Please follow these instructions carefully.   1. Shower the NIGHT BEFORE SURGERY and the MORNING OF SURGERY  2. If you chose to wash your hair, wash your hair first as usual with your normal shampoo.  3. After you shampoo, rinse your hair and body thoroughly to remove the  shampoo.  4. Use CHG Soap as you would any other liquid soap. You can apply CHG directly to the skin and wash gently with a scrungie or a clean washcloth.   5. Apply the CHG Soap to your body ONLY FROM THE NECK DOWN.  Do not use on open wounds or open sores. Avoid contact with your eyes, ears, mouth and genitals (private parts). Wash Face and genitals (private parts)  with your normal soap.   6. Wash thoroughly, paying special attention to the area where your surgery will be performed.  7. Thoroughly rinse your body with warm water from the neck down.  8. DO NOT shower/wash with your  normal soap after using and rinsing off the CHG Soap.  9. Pat yourself dry with a CLEAN TOWEL.  10. Wear CLEAN PAJAMAS to bed the night before surgery  11. Place CLEAN SHEETS on your bed the night before your surgery  12. DO NOT SLEEP WITH PETS.   Day of Surgery: Shower with CHG soap. Do not wear jewelry. Do not wear lotions, powders, colognes, or deodorant. Men may shave face and neck. Do not bring valuables to the [facility]. [facility] is not responsible for any belongings or valuables. Wear Clean/Comfortable clothing the morning of surgery Remember to brush your teeth WITH YOUR REGULAR TOOTHPASTE.   Please read over the following fact sheets that you were given.

## 2021-01-16 ENCOUNTER — Other Ambulatory Visit: Payer: Self-pay

## 2021-01-16 ENCOUNTER — Encounter (HOSPITAL_COMMUNITY)
Admission: RE | Admit: 2021-01-16 | Discharge: 2021-01-16 | Disposition: A | Discharge status: Home / Self Care | Payer: 59 | Source: Ambulatory Visit | Attending: Orthopaedic Surgery | Admitting: Orthopaedic Surgery

## 2021-01-16 ENCOUNTER — Encounter (HOSPITAL_COMMUNITY): Payer: Self-pay

## 2021-01-16 DIAGNOSIS — Z01812 Encounter for preprocedural laboratory examination: Secondary | ICD-10-CM | POA: Diagnosis not present

## 2021-01-16 DIAGNOSIS — Z20822 Contact with and (suspected) exposure to covid-19: Secondary | ICD-10-CM | POA: Insufficient documentation

## 2021-01-16 LAB — TYPE AND SCREEN
ABO/RH(D): AB POS
Antibody Screen: NEGATIVE

## 2021-01-16 LAB — CBC
HCT: 44.3 % (ref 39.0–52.0)
Hemoglobin: 14.7 g/dL (ref 13.0–17.0)
MCH: 30 pg (ref 26.0–34.0)
MCHC: 33.2 g/dL (ref 30.0–36.0)
MCV: 90.4 fL (ref 80.0–100.0)
Platelets: 252 10*3/uL (ref 150–400)
RBC: 4.9 MIL/uL (ref 4.22–5.81)
RDW: 13.1 % (ref 11.5–15.5)
WBC: 5.8 10*3/uL (ref 4.0–10.5)
nRBC: 0 % (ref 0.0–0.2)

## 2021-01-16 LAB — SURGICAL PCR SCREEN
MRSA, PCR: NEGATIVE
Staphylococcus aureus: NEGATIVE

## 2021-01-16 NOTE — Progress Notes (Signed)
PCP - Rosita Fire, MD Cardiologist - Denies  PPM/ICD - Denies  Chest x-ray - N/A EKG - N/A Stress Test - Denies ECHO - Denies Cardiac Cath - Denies  Sleep Study - Denies CPAP - N/A  Pt denies hx of Diabetes or pre-diabetes.  Blood Thinner Instructions: N/A Aspirin Instructions:N/A  ERAS Protcol -Yes PRE-SURGERY Ensure or G2- Ensure  COVID TEST- 01/16/2021.  Instructions of quarantine given.    Anesthesia review: No  Patient denies shortness of breath, fever, cough and chest pain at PAT appointment   All instructions explained to the patient, with a verbal understanding of the material. Patient agrees to go over the instructions while at home for a better understanding. Patient also instructed to self quarantine after being tested for COVID-19. The opportunity to ask questions was provided.

## 2021-01-17 LAB — SARS CORONAVIRUS 2 (TAT 6-24 HRS): SARS Coronavirus 2: NEGATIVE

## 2021-01-20 ENCOUNTER — Observation Stay (HOSPITAL_COMMUNITY)
Admission: RE | Admit: 2021-01-20 | Discharge: 2021-01-21 | Disposition: A | Discharge status: Home / Self Care | Payer: 59 | Attending: Orthopaedic Surgery | Admitting: Orthopaedic Surgery

## 2021-01-20 ENCOUNTER — Ambulatory Visit (HOSPITAL_COMMUNITY): Payer: 59

## 2021-01-20 ENCOUNTER — Ambulatory Visit (HOSPITAL_COMMUNITY): Payer: 59 | Admitting: Vascular Surgery

## 2021-01-20 ENCOUNTER — Encounter (HOSPITAL_COMMUNITY): Payer: Self-pay | Admitting: Orthopaedic Surgery

## 2021-01-20 ENCOUNTER — Other Ambulatory Visit: Payer: Self-pay

## 2021-01-20 ENCOUNTER — Encounter (HOSPITAL_COMMUNITY)
Admission: RE | Disposition: A | Discharge status: Home / Self Care | Payer: Self-pay | Source: Home / Self Care | Attending: Orthopaedic Surgery

## 2021-01-20 ENCOUNTER — Observation Stay (HOSPITAL_COMMUNITY): Payer: 59

## 2021-01-20 DIAGNOSIS — M1611 Unilateral primary osteoarthritis, right hip: Principal | ICD-10-CM | POA: Insufficient documentation

## 2021-01-20 DIAGNOSIS — Z419 Encounter for procedure for purposes other than remedying health state, unspecified: Secondary | ICD-10-CM

## 2021-01-20 DIAGNOSIS — Z96641 Presence of right artificial hip joint: Secondary | ICD-10-CM

## 2021-01-20 HISTORY — PX: TOTAL HIP ARTHROPLASTY: SHX124

## 2021-01-20 LAB — ABO/RH: ABO/RH(D): AB POS

## 2021-01-20 SURGERY — ARTHROPLASTY, HIP, TOTAL, ANTERIOR APPROACH
Anesthesia: General | Site: Hip | Laterality: Right

## 2021-01-20 MED ORDER — ROCURONIUM BROMIDE 10 MG/ML (PF) SYRINGE
PREFILLED_SYRINGE | INTRAVENOUS | Status: DC | PRN
  Administered 2021-01-20: 50 mg via INTRAVENOUS
  Administered 2021-01-20: 20 mg via INTRAVENOUS

## 2021-01-20 MED ORDER — METHOCARBAMOL 500 MG PO TABS
500.0000 mg | ORAL_TABLET | Freq: Four times a day (QID) | ORAL | Status: DC | PRN
Start: 2021-01-20 — End: 2021-01-21

## 2021-01-20 MED ORDER — STERILE WATER FOR IRRIGATION IR SOLN
Status: DC | PRN
  Administered 2021-01-20: 1000 mL

## 2021-01-20 MED ORDER — ONDANSETRON HCL 4 MG/2ML IJ SOLN
INTRAMUSCULAR | Status: DC | PRN
  Administered 2021-01-20: 4 mg via INTRAVENOUS

## 2021-01-20 MED ORDER — LIDOCAINE 2% (20 MG/ML) 5 ML SYRINGE
INTRAMUSCULAR | Status: DC | PRN
  Administered 2021-01-20: 60 mg via INTRAVENOUS

## 2021-01-20 MED ORDER — CHLORHEXIDINE GLUCONATE 0.12 % MT SOLN: 15.0000 mL | Freq: Once | OROMUCOSAL | Status: AC

## 2021-01-20 MED ORDER — LABETALOL HCL 5 MG/ML IV SOLN
INTRAVENOUS | Status: DC | PRN
  Administered 2021-01-20: 5 mg via INTRAVENOUS

## 2021-01-20 MED ORDER — ONDANSETRON HCL 4 MG PO TABS: 4.0000 mg | ORAL_TABLET | Freq: Four times a day (QID) | ORAL | Status: DC | PRN

## 2021-01-20 MED ORDER — SUGAMMADEX SODIUM 200 MG/2ML IV SOLN
INTRAVENOUS | Status: DC | PRN
  Administered 2021-01-20: 200 mg via INTRAVENOUS

## 2021-01-20 MED ORDER — CEFAZOLIN SODIUM-DEXTROSE 1-4 GM/50ML-% IV SOLN
1.0000 g | Freq: Four times a day (QID) | INTRAVENOUS | Status: AC
  Administered 2021-01-20 – 2021-01-21 (×2): 1 g via INTRAVENOUS
  Filled 2021-01-20 (×2): qty 50

## 2021-01-20 MED ORDER — METHOCARBAMOL 1000 MG/10ML IJ SOLN
500.0000 mg | Freq: Four times a day (QID) | INTRAVENOUS | Status: DC | PRN
  Filled 2021-01-20: qty 5

## 2021-01-20 MED ORDER — ONDANSETRON HCL 4 MG/2ML IJ SOLN
INTRAMUSCULAR | Status: AC
  Filled 2021-01-20: qty 2

## 2021-01-20 MED ORDER — CHLORHEXIDINE GLUCONATE 0.12 % MT SOLN
OROMUCOSAL | Status: AC
  Administered 2021-01-20: 15 mL via OROMUCOSAL
  Filled 2021-01-20: qty 15

## 2021-01-20 MED ORDER — DEXAMETHASONE SODIUM PHOSPHATE 10 MG/ML IJ SOLN
INTRAMUSCULAR | Status: DC | PRN
  Administered 2021-01-20: 5 mg via INTRAVENOUS

## 2021-01-20 MED ORDER — LACTATED RINGERS IV SOLN: INTRAVENOUS | Status: DC

## 2021-01-20 MED ORDER — METOCLOPRAMIDE HCL 5 MG PO TABS: 5.0000 mg | ORAL_TABLET | Freq: Three times a day (TID) | ORAL | Status: DC | PRN

## 2021-01-20 MED ORDER — CEFAZOLIN SODIUM-DEXTROSE 2-4 GM/100ML-% IV SOLN
INTRAVENOUS | Status: AC
  Filled 2021-01-20: qty 100

## 2021-01-20 MED ORDER — DEXMEDETOMIDINE (PRECEDEX) IN NS 20 MCG/5ML (4 MCG/ML) IV SYRINGE
PREFILLED_SYRINGE | INTRAVENOUS | Status: DC | PRN
  Administered 2021-01-20: 8 ug via INTRAVENOUS

## 2021-01-20 MED ORDER — LABETALOL HCL 5 MG/ML IV SOLN
INTRAVENOUS | Status: AC
  Filled 2021-01-20: qty 4

## 2021-01-20 MED ORDER — DOCUSATE SODIUM 100 MG PO CAPS
100.0000 mg | ORAL_CAPSULE | Freq: Two times a day (BID) | ORAL | Status: DC
  Administered 2021-01-20 – 2021-01-21 (×2): 100 mg via ORAL
  Filled 2021-01-20 (×2): qty 1

## 2021-01-20 MED ORDER — ALUM & MAG HYDROXIDE-SIMETH 200-200-20 MG/5ML PO SUSP: 30.0000 mL | ORAL | Status: DC | PRN

## 2021-01-20 MED ORDER — MIDAZOLAM HCL 2 MG/2ML IJ SOLN
INTRAMUSCULAR | Status: DC | PRN
  Administered 2021-01-20: 2 mg via INTRAVENOUS

## 2021-01-20 MED ORDER — LIDOCAINE 2% (20 MG/ML) 5 ML SYRINGE
INTRAMUSCULAR | Status: AC
  Filled 2021-01-20: qty 5

## 2021-01-20 MED ORDER — PROPOFOL 10 MG/ML IV BOLUS
INTRAVENOUS | Status: AC
  Filled 2021-01-20: qty 20

## 2021-01-20 MED ORDER — ACETAMINOPHEN 10 MG/ML IV SOLN
INTRAVENOUS | Status: DC | PRN
  Administered 2021-01-20: 1000 mg via INTRAVENOUS

## 2021-01-20 MED ORDER — OXYCODONE HCL 5 MG PO TABS
5.0000 mg | ORAL_TABLET | ORAL | Status: DC | PRN
  Administered 2021-01-20 – 2021-01-21 (×3): 10 mg via ORAL
  Filled 2021-01-20 (×3): qty 2

## 2021-01-20 MED ORDER — ROCURONIUM BROMIDE 10 MG/ML (PF) SYRINGE
PREFILLED_SYRINGE | INTRAVENOUS | Status: AC
  Filled 2021-01-20: qty 10

## 2021-01-20 MED ORDER — FENTANYL CITRATE (PF) 100 MCG/2ML IJ SOLN
25.0000 ug | INTRAMUSCULAR | Status: DC | PRN
  Administered 2021-01-20: 50 ug via INTRAVENOUS

## 2021-01-20 MED ORDER — FENTANYL CITRATE (PF) 250 MCG/5ML IJ SOLN
INTRAMUSCULAR | Status: AC
  Filled 2021-01-20: qty 5

## 2021-01-20 MED ORDER — MIDAZOLAM HCL 2 MG/2ML IJ SOLN
INTRAMUSCULAR | Status: AC
  Filled 2021-01-20: qty 2

## 2021-01-20 MED ORDER — PROMETHAZINE HCL 25 MG/ML IJ SOLN: 6.2500 mg | INTRAMUSCULAR | Status: DC | PRN

## 2021-01-20 MED ORDER — POVIDONE-IODINE 10 % EX SWAB
2.0000 "application " | Freq: Once | CUTANEOUS | Status: AC
  Administered 2021-01-20: 2 via TOPICAL

## 2021-01-20 MED ORDER — FENTANYL CITRATE (PF) 250 MCG/5ML IJ SOLN
INTRAMUSCULAR | Status: DC | PRN
  Administered 2021-01-20: 50 ug via INTRAVENOUS
  Administered 2021-01-20: 100 ug via INTRAVENOUS
  Administered 2021-01-20: 50 ug via INTRAVENOUS

## 2021-01-20 MED ORDER — HYDROMORPHONE HCL 1 MG/ML IJ SOLN
0.5000 mg | INTRAMUSCULAR | Status: DC | PRN
  Administered 2021-01-20 – 2021-01-21 (×3): 1 mg via INTRAVENOUS
  Filled 2021-01-20 (×3): qty 1

## 2021-01-20 MED ORDER — OXYCODONE HCL 5 MG PO TABS
10.0000 mg | ORAL_TABLET | ORAL | Status: DC | PRN
  Administered 2021-01-20: 15 mg via ORAL
  Filled 2021-01-20: qty 3

## 2021-01-20 MED ORDER — 0.9 % SODIUM CHLORIDE (POUR BTL) OPTIME
TOPICAL | Status: DC | PRN
  Administered 2021-01-20: 1000 mL

## 2021-01-20 MED ORDER — POLYETHYLENE GLYCOL 3350 17 G PO PACK: 17.0000 g | PACK | Freq: Every day | ORAL | Status: DC | PRN

## 2021-01-20 MED ORDER — ACETAMINOPHEN 325 MG PO TABS: 325.0000 mg | ORAL_TABLET | Freq: Four times a day (QID) | ORAL | Status: DC | PRN

## 2021-01-20 MED ORDER — ZOLPIDEM TARTRATE 5 MG PO TABS: 5.0000 mg | ORAL_TABLET | Freq: Every evening | ORAL | Status: DC | PRN

## 2021-01-20 MED ORDER — ALLOPURINOL 300 MG PO TABS
300.0000 mg | ORAL_TABLET | Freq: Every day | ORAL | Status: DC
  Administered 2021-01-20 – 2021-01-21 (×2): 300 mg via ORAL
  Filled 2021-01-20 (×2): qty 1

## 2021-01-20 MED ORDER — SODIUM CHLORIDE 0.9 % IV SOLN: INTRAVENOUS | Status: DC

## 2021-01-20 MED ORDER — FENTANYL CITRATE (PF) 100 MCG/2ML IJ SOLN
INTRAMUSCULAR | Status: AC
  Administered 2021-01-20: 50 ug via INTRAVENOUS
  Filled 2021-01-20: qty 2

## 2021-01-20 MED ORDER — DIPHENHYDRAMINE HCL 12.5 MG/5ML PO ELIX: 12.5000 mg | ORAL_SOLUTION | ORAL | Status: DC | PRN

## 2021-01-20 MED ORDER — MENTHOL 3 MG MT LOZG: 1.0000 | LOZENGE | OROMUCOSAL | Status: DC | PRN

## 2021-01-20 MED ORDER — ACETAMINOPHEN 10 MG/ML IV SOLN
INTRAVENOUS | Status: AC
  Filled 2021-01-20: qty 100

## 2021-01-20 MED ORDER — ACETAMINOPHEN 500 MG PO TABS: 1000.0000 mg | ORAL_TABLET | Freq: Once | ORAL | Status: DC

## 2021-01-20 MED ORDER — ONDANSETRON HCL 4 MG/2ML IJ SOLN: 4.0000 mg | Freq: Four times a day (QID) | INTRAMUSCULAR | Status: DC | PRN

## 2021-01-20 MED ORDER — ORAL CARE MOUTH RINSE: 15.0000 mL | Freq: Once | OROMUCOSAL | Status: AC

## 2021-01-20 MED ORDER — TRANEXAMIC ACID-NACL 1000-0.7 MG/100ML-% IV SOLN
INTRAVENOUS | Status: AC
  Filled 2021-01-20: qty 100

## 2021-01-20 MED ORDER — DEXAMETHASONE SODIUM PHOSPHATE 10 MG/ML IJ SOLN
INTRAMUSCULAR | Status: AC
  Filled 2021-01-20: qty 1

## 2021-01-20 MED ORDER — SODIUM CHLORIDE 0.9 % IR SOLN
Status: DC | PRN
  Administered 2021-01-20: 1000 mL

## 2021-01-20 MED ORDER — PHENOL 1.4 % MT LIQD: 1.0000 | OROMUCOSAL | Status: DC | PRN

## 2021-01-20 MED ORDER — PROPOFOL 10 MG/ML IV BOLUS
INTRAVENOUS | Status: DC | PRN
  Administered 2021-01-20: 200 mg via INTRAVENOUS
  Administered 2021-01-20: 50 mg via INTRAVENOUS

## 2021-01-20 MED ORDER — METOCLOPRAMIDE HCL 5 MG/ML IJ SOLN: 5.0000 mg | Freq: Three times a day (TID) | INTRAMUSCULAR | Status: DC | PRN

## 2021-01-20 MED ORDER — ASPIRIN 81 MG PO CHEW
81.0000 mg | CHEWABLE_TABLET | Freq: Two times a day (BID) | ORAL | Status: DC
  Administered 2021-01-20 – 2021-01-21 (×2): 81 mg via ORAL
  Filled 2021-01-20 (×2): qty 1

## 2021-01-20 MED ORDER — CEFAZOLIN SODIUM-DEXTROSE 2-4 GM/100ML-% IV SOLN
2.0000 g | INTRAVENOUS | Status: AC
  Administered 2021-01-20: 2 g via INTRAVENOUS

## 2021-01-20 MED ORDER — PANTOPRAZOLE SODIUM 40 MG PO TBEC
40.0000 mg | DELAYED_RELEASE_TABLET | Freq: Every day | ORAL | Status: DC
  Administered 2021-01-20 – 2021-01-21 (×2): 40 mg via ORAL
  Filled 2021-01-20: qty 2
  Filled 2021-01-20: qty 1

## 2021-01-20 MED ORDER — TRANEXAMIC ACID-NACL 1000-0.7 MG/100ML-% IV SOLN
1000.0000 mg | INTRAVENOUS | Status: AC
  Administered 2021-01-20: 1000 mg via INTRAVENOUS

## 2021-01-20 SURGICAL SUPPLY — 57 items
ACETAB CUP W/GRIPTION 54 (Plate) ×2 IMPLANT
APL SKNCLS STERI-STRIP NONHPOA (GAUZE/BANDAGES/DRESSINGS)
BENZOIN TINCTURE PRP APPL 2/3 (GAUZE/BANDAGES/DRESSINGS) ×1 IMPLANT
BLADE CLIPPER SURG (BLADE) IMPLANT
BLADE SAW SGTL 18X1.27X75 (BLADE) ×2 IMPLANT
COVER SURGICAL LIGHT HANDLE (MISCELLANEOUS) ×2 IMPLANT
COVER WAND RF STERILE (DRAPES) ×2 IMPLANT
CUP ACETAB W/GRIPTION 54 (Plate) IMPLANT
DRAPE C-ARM 42X72 X-RAY (DRAPES) ×2 IMPLANT
DRAPE STERI IOBAN 125X83 (DRAPES) ×2 IMPLANT
DRAPE U-SHAPE 47X51 STRL (DRAPES) ×6 IMPLANT
DRSG AQUACEL AG ADV 3.5X10 (GAUZE/BANDAGES/DRESSINGS) ×2 IMPLANT
DURAPREP 26ML APPLICATOR (WOUND CARE) ×2 IMPLANT
ELECT BLADE 4.0 EZ CLEAN MEGAD (MISCELLANEOUS) ×2
ELECT BLADE 6.5 EXT (BLADE) IMPLANT
ELECT REM PT RETURN 9FT ADLT (ELECTROSURGICAL) ×2
ELECTRODE BLDE 4.0 EZ CLN MEGD (MISCELLANEOUS) ×1 IMPLANT
ELECTRODE REM PT RTRN 9FT ADLT (ELECTROSURGICAL) ×1 IMPLANT
FACESHIELD WRAPAROUND (MASK) ×4 IMPLANT
FACESHIELD WRAPAROUND OR TEAM (MASK) ×2 IMPLANT
FEM STEM 12/14 TAPER SZ 4 HIP (Orthopedic Implant) ×2 IMPLANT
FEMORAL STEM 12/14 TPR SZ4 HIP (Orthopedic Implant) IMPLANT
GLOVE ECLIPSE 8.0 STRL XLNG CF (GLOVE) ×2 IMPLANT
GLOVE ORTHO TXT STRL SZ7.5 (GLOVE) ×4 IMPLANT
GLOVE SRG 8 PF TXTR STRL LF DI (GLOVE) ×2 IMPLANT
GLOVE SURG UNDER POLY LF SZ8 (GLOVE) ×4
GOWN STRL REUS W/ TWL LRG LVL3 (GOWN DISPOSABLE) ×2 IMPLANT
GOWN STRL REUS W/ TWL XL LVL3 (GOWN DISPOSABLE) ×2 IMPLANT
GOWN STRL REUS W/TWL LRG LVL3 (GOWN DISPOSABLE) ×4
GOWN STRL REUS W/TWL XL LVL3 (GOWN DISPOSABLE) ×4
HANDPIECE INTERPULSE COAX TIP (DISPOSABLE) ×2
HEAD CERAMIC DELTA 36 PLUS 1.5 (Hips) ×1 IMPLANT
KIT BASIN OR (CUSTOM PROCEDURE TRAY) ×2 IMPLANT
KIT TURNOVER KIT B (KITS) ×2 IMPLANT
LINER NEUTRAL 36ID 54OD (Liner) ×1 IMPLANT
MANIFOLD NEPTUNE II (INSTRUMENTS) ×2 IMPLANT
NS IRRIG 1000ML POUR BTL (IV SOLUTION) ×2 IMPLANT
PACK TOTAL JOINT (CUSTOM PROCEDURE TRAY) ×2 IMPLANT
PACK UNIVERSAL I (CUSTOM PROCEDURE TRAY) ×1 IMPLANT
PAD ARMBOARD 7.5X6 YLW CONV (MISCELLANEOUS) ×2 IMPLANT
SET HNDPC FAN SPRY TIP SCT (DISPOSABLE) ×1 IMPLANT
STAPLER VISISTAT 35W (STAPLE) IMPLANT
STRIP CLOSURE SKIN 1/2X4 (GAUZE/BANDAGES/DRESSINGS) ×4 IMPLANT
SUT ETHIBOND NAB CT1 #1 30IN (SUTURE) ×2 IMPLANT
SUT MNCRL AB 4-0 PS2 18 (SUTURE) IMPLANT
SUT VIC AB 0 CT1 27 (SUTURE) ×2
SUT VIC AB 0 CT1 27XBRD ANBCTR (SUTURE) ×1 IMPLANT
SUT VIC AB 1 CT1 27 (SUTURE) ×2
SUT VIC AB 1 CT1 27XBRD ANBCTR (SUTURE) ×1 IMPLANT
SUT VIC AB 2-0 CT1 27 (SUTURE) ×2
SUT VIC AB 2-0 CT1 TAPERPNT 27 (SUTURE) ×1 IMPLANT
TOWEL GREEN STERILE (TOWEL DISPOSABLE) ×2 IMPLANT
TOWEL GREEN STERILE FF (TOWEL DISPOSABLE) ×2 IMPLANT
TRAY CATH 16FR W/PLASTIC CATH (SET/KITS/TRAYS/PACK) IMPLANT
TRAY FOLEY W/BAG SLVR 16FR (SET/KITS/TRAYS/PACK)
TRAY FOLEY W/BAG SLVR 16FR ST (SET/KITS/TRAYS/PACK) IMPLANT
WATER STERILE IRR 1000ML POUR (IV SOLUTION) ×4 IMPLANT

## 2021-01-20 NOTE — Anesthesia Procedure Notes (Signed)
Procedure Name: Intubation Date/Time: 01/20/2021 3:14 PM Performed by: Reece Agar, CRNA Pre-anesthesia Checklist: Patient identified, Emergency Drugs available, Suction available and Patient being monitored Patient Re-evaluated:Patient Re-evaluated prior to induction Oxygen Delivery Method: Circle System Utilized Preoxygenation: Pre-oxygenation with 100% oxygen Induction Type: IV induction Ventilation: Mask ventilation without difficulty Laryngoscope Size: Mac and 4 Grade View: Grade I Tube type: Oral Tube size: 7.5 mm Number of attempts: 1 Airway Equipment and Method: Stylet Placement Confirmation: ETT inserted through vocal cords under direct vision,  positive ETCO2 and breath sounds checked- equal and bilateral Secured at: 23 cm Tube secured with: Tape Dental Injury: Teeth and Oropharynx as per pre-operative assessment  Comments: By Rexford Maus, SRNA

## 2021-01-20 NOTE — Transfer of Care (Signed)
Immediate Anesthesia Transfer of Care Note  Patient: [facility]  Procedure(s) Performed: RIGHT TOTAL HIP ARTHROPLASTY ANTERIOR APPROACH (Right Hip)  Patient Location: PACU  Anesthesia Type:General  Level of Consciousness: awake and drowsy  Airway & Oxygen Therapy: Patient Spontanous Breathing and Patient connected to face mask oxygen  Post-op Assessment: Report given to RN and Post -op Vital signs reviewed and stable  Post vital signs: Reviewed and stable  Last Vitals:  Vitals Value Taken Time  BP 143/90 01/20/21 1657  Temp    Pulse 70 01/20/21 1659  Resp 12 01/20/21 1659  SpO2 100 % 01/20/21 1659  Vitals shown include unvalidated device data.  Last Pain:  Vitals:   01/20/21 1201  TempSrc:   PainSc: 0-No pain         Complications: No complications documented.

## 2021-01-20 NOTE — Anesthesia Preprocedure Evaluation (Signed)
Anesthesia Evaluation  Patient identified by MRN, date of birth, ID band Patient awake    Reviewed: Allergy & Precautions, NPO status , Patient's Chart, lab work & pertinent test results  Airway Mallampati: II  TM Distance: >3 FB     Dental  (+) Dental Advisory Given   Pulmonary neg pulmonary ROS,    breath sounds clear to auscultation       Cardiovascular negative cardio ROS   Rhythm:Regular Rate:Normal     Neuro/Psych negative neurological ROS     GI/Hepatic negative GI ROS, Neg liver ROS,   Endo/Other  negative endocrine ROS  Renal/GU negative Renal ROS     Musculoskeletal  (+) Arthritis ,   Abdominal   Peds  Hematology negative hematology ROS (+)   Anesthesia Other Findings   Reproductive/Obstetrics                             Anesthesia Physical Anesthesia Plan  ASA: II  Anesthesia Plan: General   Post-op Pain Management:    Induction: Intravenous  PONV Risk Score and Plan: 2 and Dexamethasone, Ondansetron and Treatment may vary due to age or medical condition  Airway Management Planned: Oral ETT  Additional Equipment: None  Intra-op Plan:   Post-operative Plan: Extubation in OR  Informed Consent: I have reviewed the patients History and Physical, chart, labs and discussed the procedure including the risks, benefits and alternatives for the proposed anesthesia with the patient or authorized representative who has indicated his/her understanding and acceptance.     Dental advisory given  Plan Discussed with: CRNA  Anesthesia Plan Comments:         Anesthesia Quick Evaluation
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
## 2021-01-20 NOTE — Brief Op Note (Signed)
01/20/2021  4:35 PM  PATIENT:  Giovan Risby  55 y.o. male  PRE-OPERATIVE DIAGNOSIS:  Osteoarthritis Right Hip  POST-OPERATIVE DIAGNOSIS:  Osteoarthritis Right Hip  PROCEDURE:  Procedure(s): RIGHT TOTAL HIP ARTHROPLASTY ANTERIOR APPROACH (Right)  SURGEON:  Surgeon(s) and Role:    Mcarthur Rossetti, MD - Primary  PHYSICIAN ASSISTANT:  Benita Stabile, PA-C  ANESTHESIA:   general  EBL:  150 mL   COUNTS:  YES  DICTATION: .Other Dictation: Dictation Number [id number]  PLAN OF CARE: Admit for overnight observation  PATIENT DISPOSITION:  PACU - hemodynamically stable.   Delay start of Pharmacological VTE agent (>24hrs) due to surgical blood loss or risk of bleeding: no

## 2021-01-21 ENCOUNTER — Encounter (HOSPITAL_COMMUNITY): Payer: Self-pay | Admitting: Orthopaedic Surgery

## 2021-01-21 DIAGNOSIS — M1611 Unilateral primary osteoarthritis, right hip: Secondary | ICD-10-CM | POA: Diagnosis not present

## 2021-01-21 LAB — BASIC METABOLIC PANEL
Anion gap: 9 (ref 5–15)
BUN: 15 mg/dL (ref 6–20)
CO2: 25 mmol/L (ref 22–32)
Calcium: 8.8 mg/dL — ABNORMAL LOW (ref 8.9–10.3)
Chloride: 102 mmol/L (ref 98–111)
Creatinine, Ser: 1.1 mg/dL (ref 0.61–1.24)
GFR, Estimated: >60 mL/min (ref >60)
Glucose, Bld: 215 mg/dL — ABNORMAL HIGH (ref 70–99)
Potassium: 4.6 mmol/L (ref 3.5–5.1)
Sodium: 136 mmol/L (ref 135–145)

## 2021-01-21 LAB — CBC
HCT: 38.2 % — ABNORMAL LOW (ref 39.0–52.0)
Hemoglobin: 12.9 g/dL — ABNORMAL LOW (ref 13.0–17.0)
MCH: 30.4 pg (ref 26.0–34.0)
MCHC: 33.8 g/dL (ref 30.0–36.0)
MCV: 89.9 fL (ref 80.0–100.0)
Platelets: 219 10*3/uL (ref 150–400)
RBC: 4.25 MIL/uL (ref 4.22–5.81)
RDW: 13.1 % (ref 11.5–15.5)
WBC: 11.5 10*3/uL — ABNORMAL HIGH (ref 4.0–10.5)
nRBC: 0 % (ref 0.0–0.2)

## 2021-01-21 MED ORDER — OXYCODONE HCL 5 MG PO TABS: 5.0000 mg | ORAL_TABLET | Freq: Four times a day (QID) | ORAL | 0 refills | Status: DC | PRN

## 2021-01-21 MED ORDER — METHOCARBAMOL 500 MG PO TABS: 500.0000 mg | ORAL_TABLET | Freq: Four times a day (QID) | ORAL | 1 refills | Status: DC | PRN

## 2021-01-21 MED ORDER — ASPIRIN 81 MG PO CHEW: 81.0000 mg | CHEWABLE_TABLET | Freq: Two times a day (BID) | ORAL | 0 refills | Status: DC

## 2021-01-21 NOTE — Plan of Care (Signed)

## 2021-01-21 NOTE — Progress Notes (Signed)
Physical Therapy Treatment Patient Details Name: Jackson Stewart MRN: [id number] DOB: April 19, 1966 Today's Date: 01/21/2021    History of Present Illness Pt admitted for elective R direct anterior THA. PMH: gout    PT Comments    Pt seen for second treatment. Pt with improved ambulation tolerance. Remains to require assist for bedmobility for R LE management. Instructed to complete HEP 3-5x/day. Wife present. Acute PT to cont to follow.   Follow Up Recommendations  Home health PT;Supervision/Assistance - 24 hour     Equipment Recommendations  3in1 (PT);Rolling walker with 5" wheels    Recommendations for Other Services       Precautions / Restrictions Precautions Precautions: Fall Restrictions Weight Bearing Restrictions: No    Mobility  Bed Mobility Overal bed mobility: Needs Assistance Bed Mobility: Supine to Sit     Supine to sit: Min assist;HOB elevated     General bed mobility comments: minA for R LE management, verbal cues for long sit position, labored effort, pt attempting to bridge hips with L LE to scoot to EOB    Transfers Overall transfer level: Needs assistance Equipment used: Rolling walker (2 wheeled) Transfers: Sit to/from Stand Sit to Stand: Min guard         General transfer comment: min guard for safety, pt with strong L twist to avoid putting pressure on R LE, pt educated on standing straight up, min guard to steady during transition of hands  Ambulation/Gait Ambulation/Gait assistance: Min guard Gait Distance (Feet): 175 Feet Assistive device: Rolling walker (2 wheeled) Gait Pattern/deviations: Step-through pattern;Step-to pattern;Decreased stride length;Decreased stance time - right;Antalgic Gait velocity: dec Gait velocity interpretation: <1.31 ft/sec, indicative of household ambulator General Gait Details: pt with improved fluidity of gait pattern compared to earlier session, focused on relaxing shoulders, standing upright, and  incresaeding R LE wbing and decreasing bilat UE wbing   Stairs             Wheelchair Mobility    Modified Rankin (Stroke Patients Only)       Balance Overall balance assessment: Mild deficits observed, not formally tested                                          Cognition Arousal/Alertness: Awake/alert Behavior During Therapy: WFL for tasks assessed/performed Overall Cognitive Status: Within Functional Limits for tasks assessed                                        Exercises Total Joint Exercises Marching in Standing: AAROM;Right;15 reps;Standing    General Comments General comments (skin integrity, edema, etc.): VSS      Pertinent Vitals/Pain Pain Assessment: 0-10 Pain Score: 6  Pain Location: R hip Pain Descriptors / Indicators: Operative site guarding Pain Intervention(s): Monitored during session    Home Living                      Prior Function            PT Goals (current goals can now be found in the care plan section) Acute Rehab PT Goals Patient Stated Goal: home today PT Goal Formulation: With patient Time For Goal Achievement: 02/11/21 Potential to Achieve Goals: Good    Frequency    7X/week      PT Plan  Current plan remains appropriate    Co-evaluation              AM-PAC PT "6 Clicks" Mobility   Outcome Measure  Help needed turning from your back to your side while in a flat bed without using bedrails?: A Little Help needed moving from lying on your back to sitting on the side of a flat bed without using bedrails?: A Little Help needed moving to and from a bed to a chair (including a wheelchair)?: A Little Help needed standing up from a chair using your arms (e.g., wheelchair or bedside chair)?: A Little Help needed to walk in hospital room?: A Little Help needed climbing 3-5 steps with a railing? : A Little 6 Click Score: 18    End of Session Equipment Utilized During  Treatment: Gait belt Activity Tolerance: Patient tolerated treatment well Patient left: in chair Nurse Communication: Mobility status PT Visit Diagnosis: Unsteadiness on feet (R26.81);Muscle weakness (generalized) (M62.81);Difficulty in walking, not elsewhere classified (R26.2);Pain Pain - Right/Left: Right Pain - part of body: Knee     Time: 1202-1228 PT Time Calculation (min) (ACUTE ONLY): 26 min  Charges:  $Gait Training: 8-22 mins $Therapeutic Exercise: 8-22 mins                     Kittie Plater, PT, DPT Acute Rehabilitation Services Pager #: [phone] Office #: [phone]    Berline Lopes 01/21/2021, 2:01 PM

## 2021-01-21 NOTE — Discharge Instructions (Signed)

## 2021-01-21 NOTE — TOC Initial Note (Signed)
Transition of Care [facility]) - Initial/Assessment Note    Patient Details  Name: Jackson Stewart MRN: [id number] Date of Birth: 29-Apr-1966  [facility]) CM/SW Contact:    Sharin Mons, RN Phone Number: 01/21/2021, 6:43 AM  Clinical Narrative:                 Admitted with OA and degenerative joint disease, R hip.  From home .          - s/p Right total hip arthroplasty, 5/18  Orders noted for home health services and DME needs: rolling walker and 3 in 1/ BSC.  Pt evaluation pending.  TOC team following and will assist with TOC needs.  Expected Discharge Plan: Home/Self Care (vs home health services) Barriers to Discharge: Continued Medical Work up   Patient Goals and CMS Choice        Expected Discharge Plan and Services Expected Discharge Plan: Home/Self Care (vs home health services)                                              Prior Living Arrangements/Services                       Activities of Daily Living Home Assistive Devices/Equipment: None ADL Screening (condition at time of admission) Patient's cognitive ability adequate to safely complete daily activities?: Yes Is the patient deaf or have difficulty hearing?: No Does the patient have difficulty seeing, even when wearing glasses/contacts?: No Does the patient have difficulty concentrating, remembering, or making decisions?: No Patient able to express need for assistance with ADLs?: Yes Does the patient have difficulty dressing or bathing?: No Independently performs ADLs?: Yes (appropriate for developmental age) Does the patient have difficulty walking or climbing stairs?: Yes Weakness of Legs: Right Weakness of Arms/Hands: None  Permission Sought/Granted                  Emotional Assessment              Admission diagnosis:  Status post total replacement of right hip [Z96.641] Patient Active Problem List   Diagnosis Date Noted  . Status post total  replacement of right hip 01/20/2021  . Unilateral primary osteoarthritis, right hip 11/05/2020  . Positive colorectal cancer screening using Cologuard test 01/04/2018   PCP:  Rosita Fire, MD Pharmacy:   [facility] [address] [place] [address] Phone: [phone] Fax: [phone]     Social Determinants of Health (SDOH) Interventions    Readmission Risk Interventions No flowsheet data found.

## 2021-01-21 NOTE — TOC Transition Note (Signed)
Transition of Care [facility]) - CM/SW Discharge Note   Patient Details  Name: [name] MRN: [id number] Date of Birth: 10/18/1965  Transition of Care [facility]) CM/SW Contact:  Sharin Mons, RN Phone Number: 01/21/2021, 1:11 PM   Clinical Narrative:    Patient will DC to: home Anticipated DC date: 01/21/2021 Family notified: yes Transport by: Corey Harold           - s/p Right total hip arthroplasty  Per MD patient ready for DC today .RN, patient, patient's family, and [place]. Home health referral made [place].DME: rolling walker and 3in 1 / BSC  will be delivered to bedside prior to d/c, referral made with [facility]. Pt without Rx med concerns. Post hospital f/u appointment noted on AVS.   RNCM will sign off for now as intervention is no longer needed. Please consult [place] again if new needs arise.   Final next level of care: [place] Barriers to Discharge: No Barriers Identified   Patient Goals and CMS Choice        Discharge Placement                       Discharge Plan and Services                DME Arranged: 3-N-1,Walker rolling DME Agency: [facility] Date DME Agency Contacted: 01/21/21 Time DME Agency Contacted: 1223 Representative spoke with at DME Agency: Freda Munro HH Arranged: [place] Agency: Other - See comment ([place]) Date [place]: 01/21/21 Time [place]: 1224 Representative spoke with at [place]: [place] ([place]) Interventions     Readmission Risk Interventions No flowsheet data found.

## 2021-01-21 NOTE — Discharge Summary (Signed)
Patient ID: Jackson Stewart MRN: [id number] DOB/AGE: Jan 27, 1966 55 y.o.  Admit date: 01/20/2021 Discharge date: 01/21/2021  Admission Diagnoses:  Principal Problem:   Unilateral primary osteoarthritis, right hip Active Problems:   Status post total replacement of right hip   Discharge Diagnoses:  Same  Past Medical History:  Diagnosis Date  . Gout     Surgeries: Procedure(s): RIGHT TOTAL HIP ARTHROPLASTY ANTERIOR APPROACH on 01/20/2021   Consultants:   Discharged Condition: Improved  Hospital Course: Jackson Stewart is an 55 y.o. male who was admitted 01/20/2021 for operative treatment ofUnilateral primary osteoarthritis, right hip. Patient has severe unremitting pain that affects sleep, daily activities, and work/hobbies. After pre-op clearance the patient was taken to the operating room on 01/20/2021 and underwent  Procedure(s): RIGHT TOTAL HIP ARTHROPLASTY ANTERIOR APPROACH.    Patient was given perioperative antibiotics:  Anti-infectives (From admission, onward)   Start     Dose/Rate Route Frequency Ordered Stop   01/20/21 2130  ceFAZolin (ANCEF) IVPB 1 g/50 mL premix        1 g 100 mL/hr over 30 Minutes Intravenous Every 6 hours 01/20/21 1644 01/21/21 0502   01/20/21 1200  ceFAZolin (ANCEF) IVPB 2g/100 mL premix        2 g 200 mL/hr over 30 Minutes Intravenous On call to O.R. 01/20/21 1149 01/20/21 1546   01/20/21 1157  ceFAZolin (ANCEF) 2-4 GM/100ML-% IVPB       Note to Pharmacy: Alvy Beal   : cabinet override      01/20/21 1157 01/20/21 2359       Patient was given sequential compression devices, early ambulation, and chemoprophylaxis to prevent DVT.  Patient benefited maximally from hospital stay and there were no complications.    Recent vital signs:  Patient Vitals for the past 24 hrs:  BP Temp Temp src Pulse Resp SpO2 Height Weight  01/21/21 0700 133/77 98.2 F (36.8 C) Oral (!) 101 18 97 % -- --  01/21/21 0520 111/71 99.2 F (37.3 C) Oral (!) 105 18  93 % -- --  01/21/21 0048 133/74 98.2 F (36.8 C) Oral (!) 111 16 93 % -- --  01/21/21 0026 -- -- Oral -- -- -- -- --  01/20/21 2013 (!) 143/88 98 F (36.7 C) Oral 91 17 94 % -- --  01/20/21 1816 (!) 154/93 97.9 F (36.6 C) Oral 75 16 95 % -- --  01/20/21 1800 (!) 146/88 98.4 F (36.9 C) -- 68 15 98 % -- --  01/20/21 1744 (!) 140/92 -- -- 73 16 97 % -- --  01/20/21 1730 128/90 98.3 F (36.8 C) -- 74 11 98 % -- --  01/20/21 1715 133/88 -- -- 70 18 99 % -- --  01/20/21 1700 134/85 97.6 F (36.4 C) -- -- -- -- -- --  01/20/21 1155 (!) 149/90 98.2 F (36.8 C) Oral 80 18 98 % 5\' 6"  (1.676 m) 96.2 kg     Recent laboratory studies:  Recent Labs    01/21/21 0217  WBC 11.5*  HGB 12.9*  HCT 38.2*  PLT 219  NA 136  K 4.6  CL 102  CO2 25  BUN 15  CREATININE 1.10  GLUCOSE 215*  CALCIUM 8.8*     Discharge Medications:   Allergies as of 01/21/2021   No Known Allergies     Medication List    TAKE these medications   allopurinol 300 MG tablet Commonly known as: ZYLOPRIM Take 300 mg by mouth daily.   ASHWAGANDHA  PO Take 1 capsule by mouth daily.   aspirin 81 MG chewable tablet Chew 1 tablet (81 mg total) by mouth 2 (two) times daily.   b complex vitamins capsule Take 1 capsule by mouth daily.   colchicine 0.6 MG tablet Take 0.6 mg by mouth daily as needed (gout).   Fish Oil 1000 MG Caps Take 1,000 mg by mouth daily.   ibuprofen 200 MG tablet Commonly known as: ADVIL Take 800 mg by mouth every 8 (eight) hours as needed (for pain.).   methocarbamol 500 MG tablet Commonly known as: ROBAXIN Take 1 tablet (500 mg total) by mouth every 6 (six) hours as needed for muscle spasms.   oxyCODONE 5 MG immediate release tablet Commonly known as: Oxy IR/ROXICODONE Take 1-2 tablets (5-10 mg total) by mouth every 6 (six) hours as needed for moderate pain (pain score 4-6).            Durable Medical Equipment  (From admission, onward)         Start     Ordered    01/20/21 1818  DME 3 n 1  Once        01/20/21 1817   01/20/21 1818  DME Walker rolling  Once       Question Answer Comment  Walker: With 5 Inch Wheels   Patient needs a walker to treat with the following condition Status post total replacement of right hip      01/20/21 1817          Diagnostic Studies: DG Pelvis Portable  Result Date: 01/20/2021 CLINICAL DATA:  Postoperative evaluation. EXAM: PORTABLE PELVIS 1-2 VIEWS COMPARISON:  None. FINDINGS: A total right hip replacement is seen without evidence of surrounding lucency to suggest the presence of hardware loosening or infection. There is no evidence of acute pelvic fracture or diastasis. A chronic deformity of the mid right femoral shaft is seen. No pelvic bone lesions are seen. Moderate to marked severity degenerative changes seen involving the left hip in the form of joint space narrowing and acetabular sclerosis. A mild-to-moderate amount of soft tissue air is seen along the lateral aspect of the right hip. Multiple radiopaque skin staples are noted. IMPRESSION: Status post total right hip replacement without evidence of associated complication. Electronically Signed   By: Virgina Norfolk M.D.   On: 01/20/2021 19:28   DG C-Arm 1-60 Min  Result Date: 01/20/2021 CLINICAL DATA:  Surgery, elective. Additional history provided: Right anterior hip. Provided fluoroscopy time 38.1 seconds. EXAM: OPERATIVE right HIP (WITH PELVIS IF PERFORMED) 3 VIEWS TECHNIQUE: Fluoroscopic spot image(s) were submitted for interpretation post-operatively. COMPARISON:  Radiographs of the right hip 11/05/2020. FINDINGS: Three intraoperative fluoroscopic images of the right hip are submitted. On the provided images, there are findings of interval right total hip arthroplasty. The femoral and acetabular components appear well seated. No unexpected finding on the provided views. IMPRESSION: Three intraoperative fluoroscopic images from right total hip arthroplasty, as  described. Electronically Signed   By: Kellie Simmering DO   On: 01/20/2021 16:47   DG HIP OPERATIVE UNILAT WITH PELVIS RIGHT  Result Date: 01/20/2021 CLINICAL DATA:  Surgery, elective. Additional history provided: Right anterior hip. Provided fluoroscopy time 38.1 seconds. EXAM: OPERATIVE right HIP (WITH PELVIS IF PERFORMED) 3 VIEWS TECHNIQUE: Fluoroscopic spot image(s) were submitted for interpretation post-operatively. COMPARISON:  Radiographs of the right hip 11/05/2020. FINDINGS: Three intraoperative fluoroscopic images of the right hip are submitted. On the provided images, there are findings of interval right total  hip arthroplasty. The femoral and acetabular components appear well seated. No unexpected finding on the provided views. IMPRESSION: Three intraoperative fluoroscopic images from right total hip arthroplasty, as described. Electronically Signed   By: Kellie Simmering DO   On: 01/20/2021 16:47    Disposition: Discharge disposition: 01-Home or Gem    Mcarthur Rossetti, MD Follow up in 2 week(s).   Specialty: Orthopedic Surgery Contact information: [address] [place] [address] [phone]                Signed: Erskine Emery 01/21/2021, 8:39 AM

## 2021-01-21 NOTE — Evaluation (Signed)
Physical Therapy Evaluation Patient Details Name: Jackson Stewart MRN: [id number] DOB: [birth year] Today's Date: 01/21/2021   History of Present Illness  Pt admitted for elective R direct anterior THA. PMH: gout  Clinical Impression  Pt admitted for above. Pt tolerate mobility well for first time out of bed. Pt with c/o tightness, pain increased from a 4/10 to 7/10 with mobility. Pt with good home set up and support, anticipate pt to progress quickly and be safe to d/c home with spouse once medically stable    Follow Up Recommendations Home health PT;Supervision/Assistance - 24 hour    Equipment Recommendations  3in1 (PT);Rolling walker with 5" wheels    Recommendations for Other Services       Precautions / Restrictions Precautions Precautions: Fall Restrictions Weight Bearing Restrictions: No      Mobility  Bed Mobility Overal bed mobility: Needs Assistance Bed Mobility: Supine to Sit     Supine to sit: Min assist;HOB elevated     General bed mobility comments: minA for R LE management, verbal cues for long sit position, labored effort, pt attempting to bridge hips with L LE to scoot to EOB    Transfers Overall transfer level: Needs assistance Equipment used: Rolling walker (2 wheeled) Transfers: Sit to/from Stand Sit to Stand: Min guard         General transfer comment: min guard for safety, pt with strong L twist to avoid putting pressure on R LE, pt educated on standing straight up, min guard to steady during transition of hands  Ambulation/Gait Ambulation/Gait assistance: Min guard Gait Distance (Feet): 120 Feet Assistive device: Rolling walker (2 wheeled) Gait Pattern/deviations: Step-through pattern;Step-to pattern;Decreased stride length;Decreased stance time - right;Antalgic Gait velocity: dec Gait velocity interpretation: <1.31 ft/sec, indicative of household ambulator General Gait Details: significant UE dependence, transitioned from step to pattern to  step through pattern with max verbal cues, pt with decreased step length, encourage pt to dec UE support and increase R LE WBing, pt reports inc pain to 7/10 from 4/10 at beginning of session  Stairs            Wheelchair Mobility    Modified Rankin (Stroke Patients Only)       Balance Overall balance assessment: Mild deficits observed, not formally tested (pt able to stand and urinate at commode without physical assist, as well as wash hands at sink without LOB)                                           Pertinent Vitals/Pain Pain Assessment: 0-10 Pain Score: 4  Pain Location: R hip Pain Descriptors / Indicators: Operative site guarding Pain Intervention(s): Monitored during session    Home Living Family/patient expects to be discharged to:: Private residence Living Arrangements: Spouse/significant other Available Help at Discharge: Family;Available 24 hours/day (wife took off work) Type of Home: House Home Access: Level entry     [place]: Two level;Able to live on main level with bedroom/bathroom Home Equipment: None      Prior Function Level of Independence: Independent         Comments: works as a Astronomer: Left    Extremity/Trunk Assessment   Upper Extremity Assessment Upper Extremity Assessment: Overall WFL for tasks assessed    Lower Extremity Assessment Lower Extremity Assessment: RLE deficits/detail RLE Deficits / Details: able  to initiate quad and glut set, about 45 deg of AA knee flexion    Cervical / Trunk Assessment Cervical / Trunk Assessment: Normal  Communication   Communication: No difficulties  Cognition Arousal/Alertness: Awake/alert Behavior During Therapy: WFL for tasks assessed/performed Overall Cognitive Status: Within Functional Limits for tasks assessed                                        General Comments General comments (skin  integrity, edema, etc.): dressing of R knee incision, VSS despite being diaphoretic, check vitals section as RN tech was taking them during PT session    Exercises Total Joint Exercises Ankle Circles/Pumps: AROM;Both;10 reps Quad Sets: AROM;Right;10 reps;Supine Gluteal Sets: AROM;Both;10 reps;Supine Heel Slides: AAROM;Right;10 reps;Supine Long Arc Quad: AROM;Right;10 reps;Seated   Assessment/Plan    PT Assessment Patient needs continued PT services  PT Problem List Decreased strength;Decreased range of motion;Decreased balance;Decreased activity tolerance;Decreased mobility;Decreased coordination;Decreased cognition;Decreased knowledge of use of DME       PT Treatment Interventions DME instruction;Gait training;Functional mobility training;Therapeutic activities;Therapeutic exercise;Stair training    PT Goals (Current goals can be found in the Care Plan section)  Acute Rehab PT Goals Patient Stated Goal: home today PT Goal Formulation: With patient Time For Goal Achievement: 02/11/21 Potential to Achieve Goals: Good    Frequency 7X/week   Barriers to discharge        Co-evaluation               AM-PAC PT "6 Clicks" Mobility  Outcome Measure Help needed turning from your back to your side while in a flat bed without using bedrails?: A Little Help needed moving from lying on your back to sitting on the side of a flat bed without using bedrails?: A Little Help needed moving to and from a bed to a chair (including a wheelchair)?: A Little Help needed standing up from a chair using your arms (e.g., wheelchair or bedside chair)?: A Little Help needed to walk in hospital room?: A Little Help needed climbing 3-5 steps with a railing? : A Little 6 Click Score: 18    End of Session Equipment Utilized During Treatment: Gait belt Activity Tolerance: Patient tolerated treatment well Patient left: in chair Nurse Communication: Mobility status PT Visit Diagnosis: Unsteadiness  on feet (R26.81);Muscle weakness (generalized) (M62.81);Difficulty in walking, not elsewhere classified (R26.2);Pain Pain - Right/Left: Right Pain - part of body: Knee    Time: 0723-0806 PT Time Calculation (min) (ACUTE ONLY): 43 min   Charges:   PT Evaluation $PT Eval Moderate Complexity: 1 Mod PT Treatments $Gait Training: 8-22 mins $Therapeutic Exercise: 8-22 mins        Kittie Plater, PT, DPT Acute Rehabilitation Services Pager #: [phone] Office #: [phone]   Berline Lopes 01/21/2021, 8:14 AM

## 2021-01-21 NOTE — Progress Notes (Signed)
Subjective: 1 Day Post-Op Procedure(s) (LRB): RIGHT TOTAL HIP ARTHROPLASTY ANTERIOR APPROACH (Right) Patient reports pain as moderate.  Complaining of stiffness and soreness about right hip. Tolerating diet well.  Objective: Vital signs in last 24 hours: Temp:  [97.6 F (36.4 C)-99.2 F (37.3 C)] 98.2 F (36.8 C) (05/18 0700) Pulse Rate:  [68-111] 101 (05/18 0700) Resp:  [11-18] 18 (05/18 0700) BP: (111-154)/(71-93) 133/77 (05/18 0700) SpO2:  [93 %-99 %] 97 % (05/18 0700) Weight:  [96.2 kg] 96.2 kg (05/17 1155)  Intake/Output from previous day: 05/17 0701 - 05/18 0700 In: 1602.4 [I.V.:1252.4; IV Piggyback:350] Out: 750 [Urine:600; Blood:150] Intake/Output this shift: No intake/output data recorded.  Recent Labs    01/21/21 0217  HGB 12.9*   Recent Labs    01/21/21 0217  WBC 11.5*  RBC 4.25  HCT 38.2*  PLT 219   Recent Labs    01/21/21 0217  NA 136  K 4.6  CL 102  CO2 25  BUN 15  CREATININE 1.10  GLUCOSE 215*  CALCIUM 8.8*   No results for input(s): LABPT, INR in the last 72 hours.  Right lower Extremity: Sensation intact distally Dorsiflexion/Plantar flexion intact Incision: dressing C/D/I Compartment soft   Assessment/Plan: 1 Day Post-Op Procedure(s) (LRB): RIGHT TOTAL HIP ARTHROPLASTY ANTERIOR APPROACH (Right) Up with therapy  Possible discharge home later today if does well with PT and remains stable.      [place] 01/21/2021, 8:34 AM

## 2021-01-21 NOTE — Anesthesia Postprocedure Evaluation (Signed)
Anesthesia Post Note  Patient: Jackson Stewart  Procedure(s) Performed: RIGHT TOTAL HIP ARTHROPLASTY ANTERIOR APPROACH (Right Hip)     Patient location during evaluation: PACU Anesthesia Type: General Level of consciousness: awake and alert Pain management: pain level controlled Vital Signs Assessment: post-procedure vital signs reviewed and stable Respiratory status: spontaneous breathing, nonlabored ventilation, respiratory function stable and patient connected to nasal cannula oxygen Cardiovascular status: blood pressure returned to baseline and stable Postop Assessment: no apparent nausea or vomiting Anesthetic complications: no   No complications documented.  Last Vitals:  Vitals:   01/21/21 0520 01/21/21 0700  BP: 111/71 133/77  Pulse: (!) 105 (!) 101  Resp: 18 18  Temp: 37.3 C 36.8 C  SpO2: 93% 97%    Last Pain:  Vitals:   01/21/21 0700  TempSrc: Oral  PainSc:                  Tiajuana Amass

## 2021-01-21 NOTE — Op Note (Signed)
Jackson Stewart, Jackson Stewart MEDICAL RECORD NO: [id number] ACCOUNT NO: [id number] DATE OF BIRTH: 05-24-1966 FACILITY: [facility] LOCATION: [facility] PHYSICIAN: Lind Guest. Ninfa Linden, MD  Operative Report   DATE OF PROCEDURE: 01/20/2021  PREOPERATIVE DIAGNOSES:  Primary osteoarthritis and degenerative joint disease, right hip.  POSTOPERATIVE DIAGNOSES:  Primary osteoarthritis and degenerative joint disease, right hip.  PROCEDURE:  Right total hip arthroplasty through direct anterior approach.  IMPLANTS:  DePuy Sector Gription acetabular component size 54, size 36+0 neutral polyethylene liner, size 4 ACTIS femoral component with high offset, size 36+1.5 ceramic hip ball.  SURGEON:  Lind Guest. Ninfa Linden, MD  ASSISTANT: Erskine Emery, PA-C  ANESTHESIA:  General.  ANTIBIOTICS:  2 g IV Ancef.  BLOOD LOSS:  160 mL  COMPLICATIONS:  None.  INDICATIONS:  The patient is a very pleasant 55 year old gentleman with debilitating arthritis involving his right hip.  I believe there is arthritis in his left hip as well, but his right hip shows severe wear of the acetabulum and femoral head with  cystic changes and sclerotic changes as well as complete loss of joint space.  His right hip pain is daily and it is detrimentally affecting his mobility, his quality of life and his activities of daily living to the point he does wish to proceed with a  total hip arthroplasty on the right side.  We talked in detail about the risk of acute blood loss anemia, nerve and vessel injury, fracture, infection, DVT, dislocation, implant failure and skin and soft tissue issues.  We talked about our goals being  decrease pain, improve mobility and overall improve quality of life.  DESCRIPTION OF PROCEDURE:  After informed consent was obtained, appropriate right hip was marked, he was brought to the operating room where general anesthesia was obtained while he was on the stretcher.  We assessed his leg length and he is actually   equal on his leg lengths on both sides clinically.  Traction boots were placed on both his feet.  Next, he was placed supine on the Hana fracture table, the perineal post in place and both legs in line skeletal traction device and no traction applied.   His right operative hip was prepped and draped with DuraPrep and sterile drapes.  A timeout was called and he was identified correct patient, correct right hip.  I then made an incision just inferior and posterior to the anterior superior iliac spine and  carried this obliquely down the leg.  We dissected down tensor fascia lata muscle.  Tensor fascia was then divided longitudinally to proceed with direct anterior approach to the hip.  We identified and cauterized circumflex vessels, then identified the  hip capsule, opened up the hip capsule in L-type format, finding moderate joint effusion and significant arthritis around the femoral head and neck.  We placed Cobra retractors within the joint capsule around the medial and lateral femoral neck and made  a femoral neck cut with an oscillating saw just proximal to the lesser trochanter and completed this with an osteotome.  We placed a corkscrew guide in the femoral head and removed the femoral head in its entirety and found a wide area devoid of  cartilage.  I then removed remnants of the acetabular labrum and other debris and periarticular osteophytes around the acetabulum.  I placed a bent Hohmann over the medial acetabular rim and then began reaming under direct visualization from a size 43  reamer going up to a size 53, with all reamers placed under direct visualization, the last  reamer was also placed under direct fluoroscopy, so we could obtain our depth of reaming, our inclination and anteversion.  I then placed a real DePuy sector  Gription acetabular component size 54, and we went with a 36+0 neutral polyethylene liner, based off his offset and positioning of the cup.  Attention was then turned to  the femur with the leg externally rotated to 120 degrees, extended and adducted. We  were able to place a Mueller retractor medially and Hohmann retractor behind the greater trochanter, we released the lateral joint capsule and used a box cutting osteotome to enter femoral canal and a rongeur to lateralize, then began broaching using the  ACTIS broaching system from a size 0 up to a size 4. With a size 4 in place, we trialed a standard offset femoral neck and a 36-2 hip ball and reduced this in the acetabulum.  We definitely needed more leg length.  It was stable otherwise and we elected  offset.  We dislocated the hip, removed the trial components.  We placed the real high offset ACTIS femoral component from DePuy for right hip and we went with a 36+1.5 ceramic hip ball and reduced this in acetabulum.  We were pleased with range of  motion, offset and stability, assessed radiographically and mechanically.  We then irrigated the soft tissue with normal saline solution using pulsatile lavage.  We were able to reapproximate the joint capsule with interrupted #1 Ethibond suture followed  by #1 Vicryl to close the tensor fascia.  0 Vicryl was used to close the deep tissue and 2-0 Vicryl was used to close subcutaneous tissue.  The skin was closed with staples.  An Aquacel dressing was applied.  He was taken off Hana table, awakened,  extubated, and taken to recovery room in stable condition with all final counts being correct.  There were no complications noted.  Of note, Benita Stabile, PA-C, assisted during the entire case and assistance was crucial for facilitating every aspect of this  case.   SHW D: 01/20/2021 4:33:33 pm T: 01/21/2021 6:33:00 am  JOB: [id number]/ [id number]

## 2021-01-22 ENCOUNTER — Telehealth: Payer: Self-pay

## 2021-01-22 NOTE — Telephone Encounter (Signed)
Pts wife called stating that her husband has passed out twice today since being home.  I did ask his wife if he had eaten she said no  And he also didn't start the medication yet.   Please advise.

## 2021-01-22 NOTE — Telephone Encounter (Signed)
Wife aware of the below message  

## 2021-01-22 NOTE — Telephone Encounter (Signed)
Have him hydrate is much as possible.  Have him also not take any blood pressure medicine if he is on that type of medication.  He should also hold on any pain medication.  If he continues to be lightheaded he should probably go to the emergency room to get checked out.

## 2021-02-03 ENCOUNTER — Other Ambulatory Visit: Payer: Self-pay

## 2021-02-03 ENCOUNTER — Encounter: Payer: Self-pay | Admitting: Orthopaedic Surgery

## 2021-02-03 ENCOUNTER — Ambulatory Visit (INDEPENDENT_AMBULATORY_CARE_PROVIDER_SITE_OTHER): Payer: 59 | Admitting: Orthopaedic Surgery

## 2021-02-03 ENCOUNTER — Telehealth: Payer: Self-pay | Admitting: Physician Assistant

## 2021-02-03 DIAGNOSIS — Z96641 Presence of right artificial hip joint: Secondary | ICD-10-CM

## 2021-02-03 NOTE — Telephone Encounter (Signed)
Called wife and advised and she stated understanding

## 2021-02-03 NOTE — Telephone Encounter (Signed)
Please advise 

## 2021-02-03 NOTE — Progress Notes (Signed)
HPI: Jackson Stewart returns today status post right total hip arthroplasty 01/20/2021.  He is overall doing well.  He is walking regularly rolling walker.  States he has some stiffness in the hip.  He is having difficulty sleeping due to discomfort in the hip.  He is taking only a muscle relaxant he stopped the pain medicine after taking it just for a few days.  He is on aspirin twice daily was on no aspirin prior to surgery.  He has had no fevers chills shortness of breath chest pain.  Physical exam: Right hip fluid motion limited due to guarding with internal and external rotation.  Surgical incisions healing well no signs of infection.  Staples well approximated.  Right calf supple nontender.  Dorsiflexion plantarflexion right ankle intact.  Impression: Status post right total hip arthroplasty 01/20/2021  Plan: He will continue to work on range of motion right hip.  He will advance the walker to a cane.  He is told he needs to walk for therapy.  Discussed good sleep hygiene with him.  Staples removed Steri-Strips applied.  He is able to get the incision wet in shower no submerging the wound.  He will continue aspirin once daily for another week and then discontinue it as he was on no aspirin prior to surgery.

## 2021-02-03 NOTE — Telephone Encounter (Signed)
Pt's wife called with her husband on the phone, wife wants to clarify when pt is eligible to drive. Wife believes [facility] Delay told them 6 weeks but husband says he can drive today. Wife just wants a call from the nurse or provider verifying when husband can drive so he will listen to the orders from them if it is not possible to drive today. The best call back number is [phone].

## 2021-02-10 ENCOUNTER — Ambulatory Visit (INDEPENDENT_AMBULATORY_CARE_PROVIDER_SITE_OTHER): Payer: 59 | Admitting: Orthopaedic Surgery

## 2021-02-10 ENCOUNTER — Other Ambulatory Visit: Payer: Self-pay

## 2021-02-10 DIAGNOSIS — Z96641 Presence of right artificial hip joint: Secondary | ICD-10-CM

## 2021-02-10 MED ORDER — DOXYCYCLINE HYCLATE 100 MG PO TABS: 100.0000 mg | ORAL_TABLET | Freq: Two times a day (BID) | ORAL | 0 refills | Status: DC

## 2021-02-10 MED ORDER — OXYCODONE HCL 5 MG PO TABS: 5.0000 mg | ORAL_TABLET | Freq: Four times a day (QID) | ORAL | 0 refills | Status: DC | PRN

## 2021-02-10 NOTE — Progress Notes (Signed)
The patient is 3 weeks out from a right total hip arthroplasty.  He is having some blistering of the superior aspect of his incision.  Has been cleaning with peroxide and placing Neosporin and a Band-Aid over the incision.  He denies any fever or chills or nausea and vomiting.  Examination of the area shows a small area of blistering just medial to the incision itself.  I think this is where the skin was abraded from the surgery itself and even Steri-Strips were applied to the skin off itself.  The incision itself looks good and there is no redness.  I will have him place mupirocin ointment on that small wound 1 time to 2 times daily after each shower.  We will send in some antibiotics just as a precaution but it does not appear worrisome.  I will see him back in just 1 week for a wound check.  I did refill his oxycodone.  All questions and concerns were answered and addressed.

## 2021-02-17 ENCOUNTER — Encounter: Payer: Self-pay | Admitting: Orthopaedic Surgery

## 2021-02-17 ENCOUNTER — Ambulatory Visit (INDEPENDENT_AMBULATORY_CARE_PROVIDER_SITE_OTHER): Payer: 59 | Admitting: Orthopaedic Surgery

## 2021-02-17 ENCOUNTER — Other Ambulatory Visit: Payer: Self-pay

## 2021-02-17 DIAGNOSIS — Z96641 Presence of right artificial hip joint: Secondary | ICD-10-CM

## 2021-02-17 NOTE — Progress Notes (Signed)
The patient is following up almost 4-week status post a right hip replacement.  I saw him last week and there is a raw area of skin at the top of the incision so we treated him with Bactroban ointment and some antibiotics just as precaution.  He is here for wound check today.  The wound looks like it is healed over nicely.  There is no evidence of infection.  He will continue to increase his activities as comfort allows.  I would like to see him back in 4 weeks to see how he is doing overall.  He should not rush back to work or other activities until he is more comfortable.  He is ambulate with a cane.  He can drive from my standpoint as long as he does not have a narcotic in his system.

## 2021-03-03 ENCOUNTER — Ambulatory Visit: Payer: 59 | Admitting: Orthopaedic Surgery

## 2021-03-17 ENCOUNTER — Other Ambulatory Visit: Payer: Self-pay

## 2021-03-17 ENCOUNTER — Ambulatory Visit (INDEPENDENT_AMBULATORY_CARE_PROVIDER_SITE_OTHER): Payer: 59 | Admitting: Orthopaedic Surgery

## 2021-03-17 ENCOUNTER — Encounter: Payer: Self-pay | Admitting: Orthopaedic Surgery

## 2021-03-17 DIAGNOSIS — Z96641 Presence of right artificial hip joint: Secondary | ICD-10-CM

## 2021-03-17 NOTE — Progress Notes (Signed)
HPI: Mr. Kapur returns today status post right total hip arthroplasty 01/20/2021.  He states he is doing okay he is getting there slowly.  He is concerned about the fact that he still cannot put on his shoes and socks easily on the right side.  He also has pain in the lateral aspect of the right hip.  [place] about the scar and if there is anything that can be done to decrease the scar process.  He has stiffness whenever he first gets up to ambulate.  He also has concerns about going back to his job which is highly physical.  Physical exam: Right hip he has limited internal and external rotation.  Motion is fluid.  Surgical incisions healing well slight keloid.  No signs of infection or wound breakdown.  Slight tenderness over the right hip trochanteric region right calf supple nontender dorsiflexion plantarflexion right ankle intact.  Impression: Status post right total hip arthroplasty 01/20/2021  Plan: Have him work on stretching particularly IT band stretching.  Suggested he try yoga or stretching exercises.  He will work on scar tissue mobilization.  He will remain out of work we will see him back in 1 month to check his progress.  Encouragement was given that his range of motion should improve with time also there is stiffness soreness that he has in the hip whenever he first begins to ambulate should dissipate around 3 months postop.  Questions were encouraged and answered

## 2021-04-14 ENCOUNTER — Encounter: Payer: Self-pay | Admitting: Orthopaedic Surgery

## 2021-04-14 ENCOUNTER — Other Ambulatory Visit: Payer: Self-pay

## 2021-04-14 ENCOUNTER — Ambulatory Visit (INDEPENDENT_AMBULATORY_CARE_PROVIDER_SITE_OTHER): Payer: 59 | Admitting: Orthopaedic Surgery

## 2021-04-14 DIAGNOSIS — Z96641 Presence of right artificial hip joint: Secondary | ICD-10-CM

## 2021-04-14 NOTE — Progress Notes (Signed)
The patient is actually 3 months today status post a right total hip arthroplasty.  He had severe arthritis in his right hip and has gone for long period time with a Trendelenburg gait dealing with his right hip.  We then replaced it 3 months ago.  He is only 53.  He does work as a Dealer and performs heavy Retail buyer.  He still walks with a slight limp with his gait.  He does have excellent range of motion of that right operative hip but it is slightly stiff.  His left hip moves smoothly.  I would like to get him an outpatient physical therapy at this standpoint for generalized conditioning and strengthening of his back and his bilateral lower extremities in order for him to perform his duties as a heavy duty Dealer.  I will give him a work note to return to work September 6.  I would like to see him back in 4 weeks and at that visit would like a standing view pelvis and lateral of his right operative hip.

## 2021-04-29 ENCOUNTER — Ambulatory Visit (HOSPITAL_COMMUNITY): Payer: 59 | Attending: Orthopaedic Surgery | Admitting: Physical Therapy

## 2021-05-13 ENCOUNTER — Ambulatory Visit: Payer: Self-pay

## 2021-05-13 ENCOUNTER — Other Ambulatory Visit: Payer: Self-pay

## 2021-05-13 ENCOUNTER — Ambulatory Visit (INDEPENDENT_AMBULATORY_CARE_PROVIDER_SITE_OTHER): Payer: 59 | Admitting: Orthopaedic Surgery

## 2021-05-13 ENCOUNTER — Encounter: Payer: Self-pay | Admitting: Orthopaedic Surgery

## 2021-05-13 DIAGNOSIS — Z96641 Presence of right artificial hip joint: Secondary | ICD-10-CM

## 2021-05-13 NOTE — Progress Notes (Signed)
The patient is almost 4 months out from a right total hip arthroplasty.  He has been doing well and reports good range of motion and strength.  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  He is scheduled to return to work tomorrow.  He is walking without assistive device.  Examination of his right happily head shows that move smoothly and fluidly.  His left hip actually moves good as well.  He is pain-free on both hips.  An AP pelvis and lateral of the right hip shows a well-seated total hip arthroplasty on the right side.  He does have significant arthritis of his left hip joint space narrowing and signs of femoral acetabular impingement.  I talked to him about his right hip and his left hip.  I would not recommend anything for the left hip for now since he is asymptomatic.  I gave him a note allowing him to return to work tomorrow.  From my standpoint, I will see him back at the 1 year follow-up with a standing low AP pelvis.  All questions and concerns were answered and addressed.

## 2021-07-10 ENCOUNTER — Telehealth: Payer: Self-pay | Admitting: Orthopaedic Surgery

## 2021-07-10 NOTE — Telephone Encounter (Signed)
Patient's wife called. She says patient is losing weight rapid. Would like to know if that is a side effect of having the surgery. Her call back number is [phone]

## 2021-07-10 NOTE — Telephone Encounter (Signed)
Pt wife aware that surgery would not cause this and to call her PCP

## 2021-08-20 ENCOUNTER — Encounter: Payer: Self-pay | Admitting: Endocrinology

## 2021-08-20 ENCOUNTER — Ambulatory Visit (INDEPENDENT_AMBULATORY_CARE_PROVIDER_SITE_OTHER): Payer: 59 | Admitting: Endocrinology

## 2021-08-20 ENCOUNTER — Other Ambulatory Visit: Payer: Self-pay

## 2021-08-20 VITALS — BP 134/80 | HR 90 | Ht 68.0 in | Wt 207.8 lb

## 2021-08-20 DIAGNOSIS — E119 Type 2 diabetes mellitus without complications: Secondary | ICD-10-CM

## 2021-08-20 LAB — POCT GLYCOSYLATED HEMOGLOBIN (HGB A1C): Hemoglobin A1C: 10.9 % — AB (ref 4.0–5.6)

## 2021-08-20 MED ORDER — METFORMIN HCL ER 500 MG PO TB24: 1000.0000 mg | ORAL_TABLET | Freq: Every day | ORAL | 3 refills | Status: DC

## 2021-08-20 MED ORDER — RYBELSUS 3 MG PO TABS: 3.0000 mg | ORAL_TABLET | Freq: Every day | ORAL | 3 refills | Status: DC
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
## 2021-08-20 NOTE — Patient Instructions (Addendum)
good diet and exercise significantly improve the control of your diabetes.  please let me know if you wish to be referred to a dietician.  high blood sugar is very risky to your health.  you should see an eye doctor and dentist every year.  It is very important to get all recommended vaccinations.  Controlling your blood pressure and cholesterol drastically reduces the damage diabetes does to your body.  Those who smoke should quit.  Please discuss these with your doctor.  check your blood sugar once a day.  vary the time of day when you check, between before the 3 meals, and at bedtime.  also check if you have symptoms of your blood sugar being too high or too low.  please keep a record of the readings and bring it to your next appointment here (or you can bring the meter itself).  You can write it on any piece of paper.  please call us sooner if your blood sugar goes below 70, or if most of your readings are over 200.  I have sent a prescription to your pharmacy, for the metformin, and to add Rybelsus.  Please come back for a follow-up appointment in 1 month.

## 2021-09-21 ENCOUNTER — Other Ambulatory Visit: Payer: Self-pay

## 2021-09-21 ENCOUNTER — Encounter: Payer: 59 | Attending: Endocrinology | Admitting: Nutrition

## 2021-09-21 ENCOUNTER — Encounter: Payer: Self-pay | Admitting: Nutrition

## 2021-09-21 VITALS — Ht 65.0 in | Wt 210.0 lb

## 2021-09-21 DIAGNOSIS — E782 Mixed hyperlipidemia: Secondary | ICD-10-CM

## 2021-09-21 DIAGNOSIS — E119 Type 2 diabetes mellitus without complications: Secondary | ICD-10-CM | POA: Insufficient documentation

## 2021-09-21 DIAGNOSIS — E1165 Type 2 diabetes mellitus with hyperglycemia: Secondary | ICD-10-CM

## 2021-09-21 NOTE — Progress Notes (Signed)
Medical Nutrition Therapy  Appointment Start time:  0800  Appointment End time:  0930 Primary concerns today: Type 2  Referral diagnosis: E11.8 Preferred learning style: No preference Learning readiness: Ready    NUTRITION ASSESSMENT  Sees Dr. Loanne Drilling, Endocrinologist in [place]. Would like to see a endocrinologist that is closer to [place], [place]. Testing once a day. Here with his wife FBS 129-140's. Testing once a day. Eating 2-3 meals per day. Usually skips breakfast because he doesn't get up til about 11 am. Metformin 500 mg BID. ER. Not taking Rybelsus. R hip replacement in 2022. Was taking  30 Lantus and it was messing with eye sight. HEestopped taking it due to his eye sight.  Anthropometrics  Wt Readings from Last 3 Encounters:  09/21/21 210 lb (95.3 kg)  08/20/21 207 lb 12.8 oz (94.3 kg)  01/20/21 212 lb (96.2 kg)   Ht Readings from Last 3 Encounters:  09/21/21 5\' 5"  (1.651 m)  08/20/21 5\' 8"  (1.727 m)  01/20/21 5\' 6"  (1.676 m)   Body mass index is 34.95 kg/m. @BMIFA @ Facility age limit for growth percentiles is 20 years. Facility age limit for growth percentiles is 20 years.    Clinical Medical Hx: HIp replacememt Medications: Metformin XR 500 mg BID Labs:  Lab Results  Component Value Date   HGBA1C 10.9 (A) 08/20/2021   CMP Latest Ref Rng & Units 01/21/2021  Glucose 70 - 99 mg/dL 215(H)  BUN 6 - 20 mg/dL 15  Creatinine 0.61 - 1.24 mg/dL 1.10  Sodium 135 - 145 mmol/L 136  Potassium 3.5 - 5.1 mmol/L 4.6  Chloride 98 - 111 mmol/L 102  CO2 22 - 32 mmol/L 25  Calcium 8.9 - 10.3 mg/dL 8.8(L)    Notable Signs/Symptoms: Increased urination, lost weight 40 lbs.   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 His wife does the cooking  and shopping. 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 Busy outside often. 7-4 pm  Estimated daily fluid intake: water 64 oz Green tea diet 20  oz Supplements: none Sleep: 4-5 hrs Stays up late and  sleeps in. Stress / self-care: none Current average weekly physical activity: busy outdoors  24-Hr Dietary Recall First Meal: 1030 pack of nabs, water Snack:  Second Meal: [place] sandwich, bacon, l/t/m  unsweet tea Snack: misc:  nabs, fruit sometimes Third Meal: Steak, fries, unsweet tea Snack: clondike bar, SF Beverages: water, unsweet tea,   Estimated Energy Needs Calories: 1800-2000 Carbohydrate: 225g Protein: 150g Fat: 58g   NUTRITION DIAGNOSIS  NB-1.1 Food and nutrition-related knowledge deficit As related to Diabetes Type 2.  As evidenced by A1C 10.9%.   NUTRITION INTERVENTION  Nutrition education (E-1) on the following topics:  Nutrition and Diabetes education provided on My Plate, CHO counting, meal planning, portion sizes, timing of meals, avoiding snacks between meals unless having a low blood sugar, target ranges for A1C and blood sugars, signs/symptoms and treatment of hyper/hypoglycemia, monitoring blood sugars, taking medications as prescribed, benefits of exercising 30 minutes per day and prevention of complications of DM. Lifestyle Medicine - Whole Food, Plant Predominant Nutrition is highly recommended: Eat Plenty of vegetables, Mushrooms, fruits, Legumes, Whole Grains, Nuts, seeds in lieu of processed meats, processed snacks/pastries red meat, poultry, eggs.    -It is better to avoid simple carbohydrates including: Cakes, Sweet Desserts, Ice Cream, Soda (diet and regular), Sweet Tea, Candies, Chips, Cookies, Store Bought Juices, Alcohol in Excess of  1-2 drinks a day, Lemonade,  Artificial Sweeteners, Doughnuts, Coffee Creamers, "Sugar-free" Products, etc, etc.  This is not a complete list.....  Exercise: If you are able: 30 -60 minutes a day ,4 days a week, or 150 minutes a week.  The longer the better.  Combine stretch, strength, and aerobic activities.  If you were told in the past that you have high risk for cardiovascular diseases, you may seek evaluation by  your heart doctor prior to initiating moderate to intense exercise programs.  Handouts Provided Include  Plant based my plate Meal Plan Card Diabetes Booklet  Learning Style & Readiness for Change Teaching method utilized: Visual & Auditory  Demonstrated degree of understanding via: Teach Back  Barriers to learning/adherence to lifestyle change: none  Goals Established by Pt Goals  Cut out eating after 7 pm. Eat three meals per day at the times we talked about Cut out processed foods. Drink a gallon of water per day Test blood sugars twice a day. Get a1c down to 6.5% or less.   MONITORING & EVALUATION Dietary intake, weekly physical activity, and blood sugars in 1 month.  Next Steps  Patient is to work on more plant based foods and eating meals on time.

## 2021-09-21 NOTE — Patient Instructions (Signed)
Goals  Cut out eating after 7 pm. Eat three meals per day at the times we talked about Cut out processed foods. Drink a gallon of water per day Test blood sugars twice a day. Get a1c down to 6.5% or less.

## 2021-09-22 ENCOUNTER — Encounter: Payer: Self-pay | Admitting: Nutrition

## 2021-10-01 ENCOUNTER — Ambulatory Visit: Payer: 59 | Admitting: Endocrinology

## 2021-10-22 ENCOUNTER — Other Ambulatory Visit: Payer: Self-pay

## 2021-10-22 ENCOUNTER — Ambulatory Visit (INDEPENDENT_AMBULATORY_CARE_PROVIDER_SITE_OTHER): Payer: 59 | Admitting: Endocrinology

## 2021-10-22 ENCOUNTER — Ambulatory Visit: Payer: 59 | Admitting: Nutrition

## 2021-10-22 VITALS — BP 138/82 | HR 86 | Ht 65.0 in | Wt 206.4 lb

## 2021-10-22 DIAGNOSIS — E119 Type 2 diabetes mellitus without complications: Secondary | ICD-10-CM | POA: Diagnosis not present

## 2021-10-22 LAB — POCT GLYCOSYLATED HEMOGLOBIN (HGB A1C): Hemoglobin A1C: 7.1 % — AB (ref 4.0–5.6)

## 2021-10-22 MED ORDER — EMPAGLIFLOZIN 10 MG PO TABS: 10.0000 mg | ORAL_TABLET | Freq: Every day | ORAL | 3 refills | Status: DC
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
## 2021-10-22 NOTE — Patient Instructions (Addendum)
check your blood sugar once a day.  vary the time of day when you check, between before the 3 meals, and at bedtime.  also check if you have symptoms of your blood sugar being too high or too low.  please keep a record of the readings and bring it to your next appointment here (or you can bring the meter itself).  You can write it on any piece of paper.  please call us sooner if your blood sugar goes below 70, or if most of your readings are over 200.  I have sent a prescription to your pharmacy, to add Jardiance You can stay off the Rybelsus.   Please continue the same metformin.   Please come back for a follow-up appointment in 2-3 months.

## 2021-10-24 DIAGNOSIS — E119 Type 2 diabetes mellitus without complications: Secondary | ICD-10-CM | POA: Insufficient documentation

## 2022-01-12 ENCOUNTER — Encounter: Payer: Self-pay | Admitting: Orthopaedic Surgery

## 2022-01-12 ENCOUNTER — Ambulatory Visit (INDEPENDENT_AMBULATORY_CARE_PROVIDER_SITE_OTHER): Payer: 59

## 2022-01-12 ENCOUNTER — Ambulatory Visit (INDEPENDENT_AMBULATORY_CARE_PROVIDER_SITE_OTHER): Payer: 59 | Admitting: Orthopaedic Surgery

## 2022-01-12 DIAGNOSIS — Z96641 Presence of right artificial hip joint: Secondary | ICD-10-CM

## 2022-01-12 NOTE — Progress Notes (Signed)
The patient is well-known to me.  He is a year out from a right total hip arthroplasty.  He said the right hip is doing great and he has good range of motion and strength.  He denies any left hip pain at all.  He is a diabetic and has had a hard time with blood glucose control.  I saw 4 months ago his hemoglobin A1c was 10.4 but 2 months ago was down to 7.1 so he is working on this.  He walks without assistive device.  He is an active 56 year old gentleman. ? ?His right operative hip moves smoothly and fluidly with no pain.  His left hip also moves smoothly and fluidly with no pain. ? ?An AP pelvis shows a well-seated right total hip arthroplasty.  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  There is superior lateral joint space narrowing which is significant for right now he is pain-free and shows no clinical signs or symptoms of significant problems. ? ?At this point follow-up is as needed.  If he starts developing any issues with his right hip or even his left hip he knows to let us know.  He will continue to work on better blood glucose control as well. ?

## 2022-01-21 ENCOUNTER — Ambulatory Visit: Payer: 59 | Admitting: Endocrinology

## 2022-02-11 IMAGING — RF DG HIP (WITH PELVIS) OPERATIVE*R*
1 series · 3 of 3 positions shown · non-contrast
Comparison: Radiographs of the right hip 11/05/2020.

CLINICAL DATA: Surgery, elective. Additional history provided:
Right anterior hip. Provided fluoroscopy time 38.1 seconds.

EXAM:
OPERATIVE right HIP (WITH PELVIS IF PERFORMED) 3 VIEWS
TECHNIQUE: Fluoroscopic spot image(s) were submitted for interpretation
post-operatively.

[Series 1: unknown protocol · 0.20mm/px · 3 of 3 slices shown]
[im 1/3]
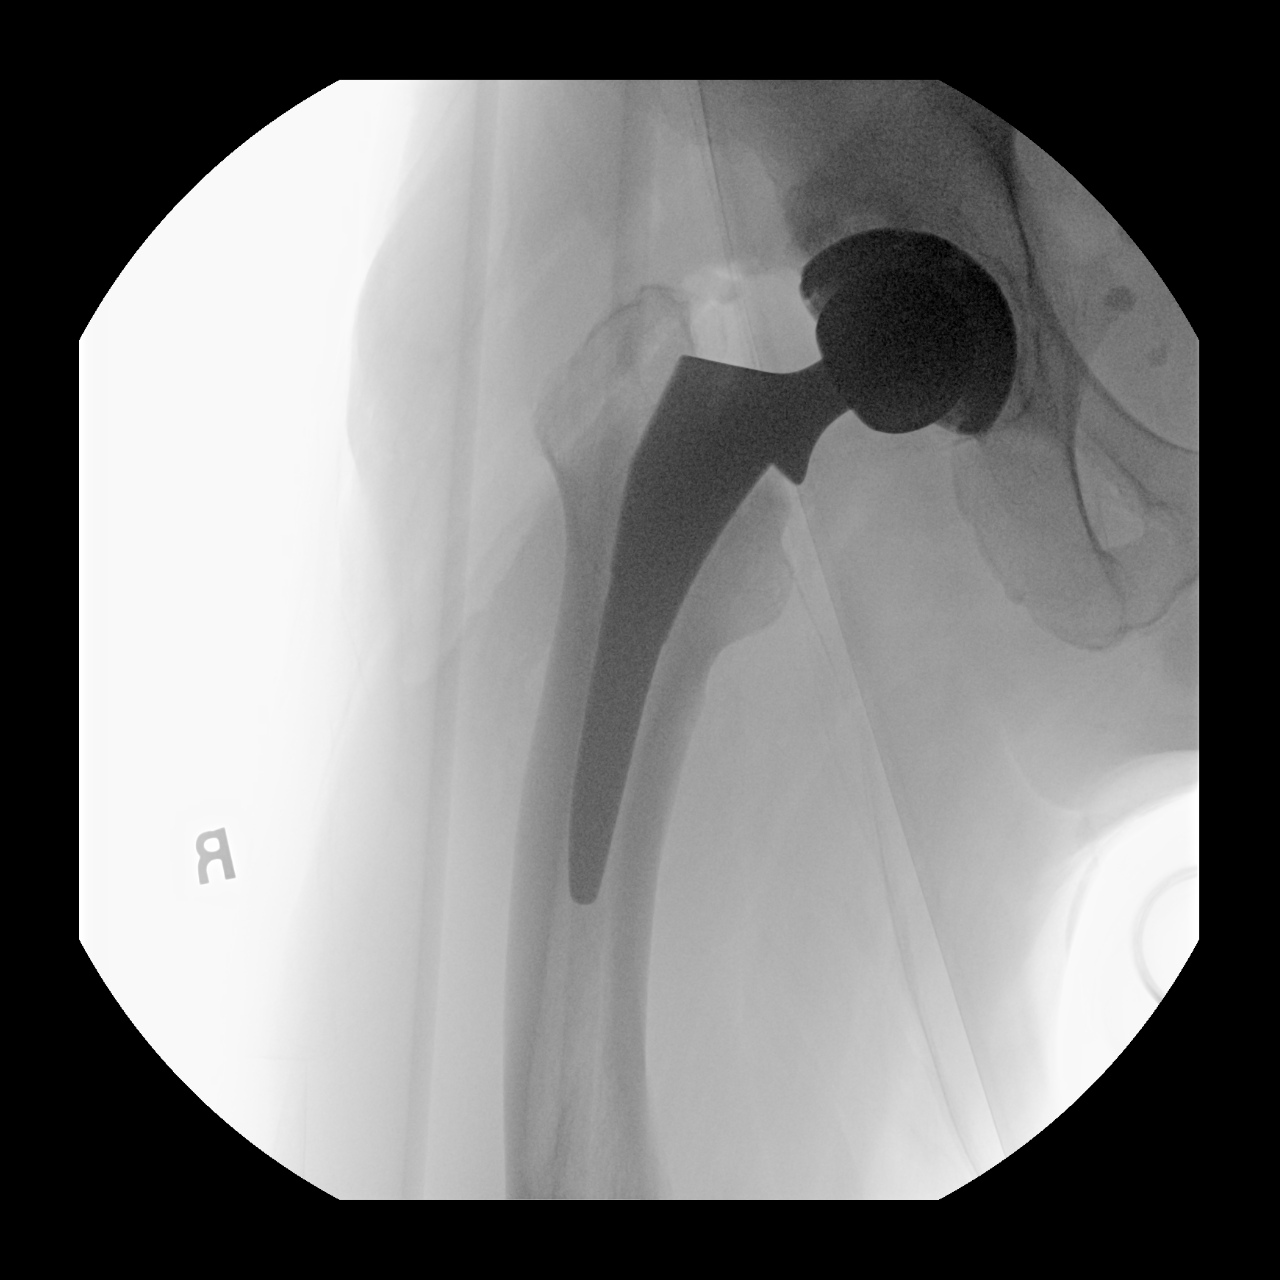
[im 2/3]
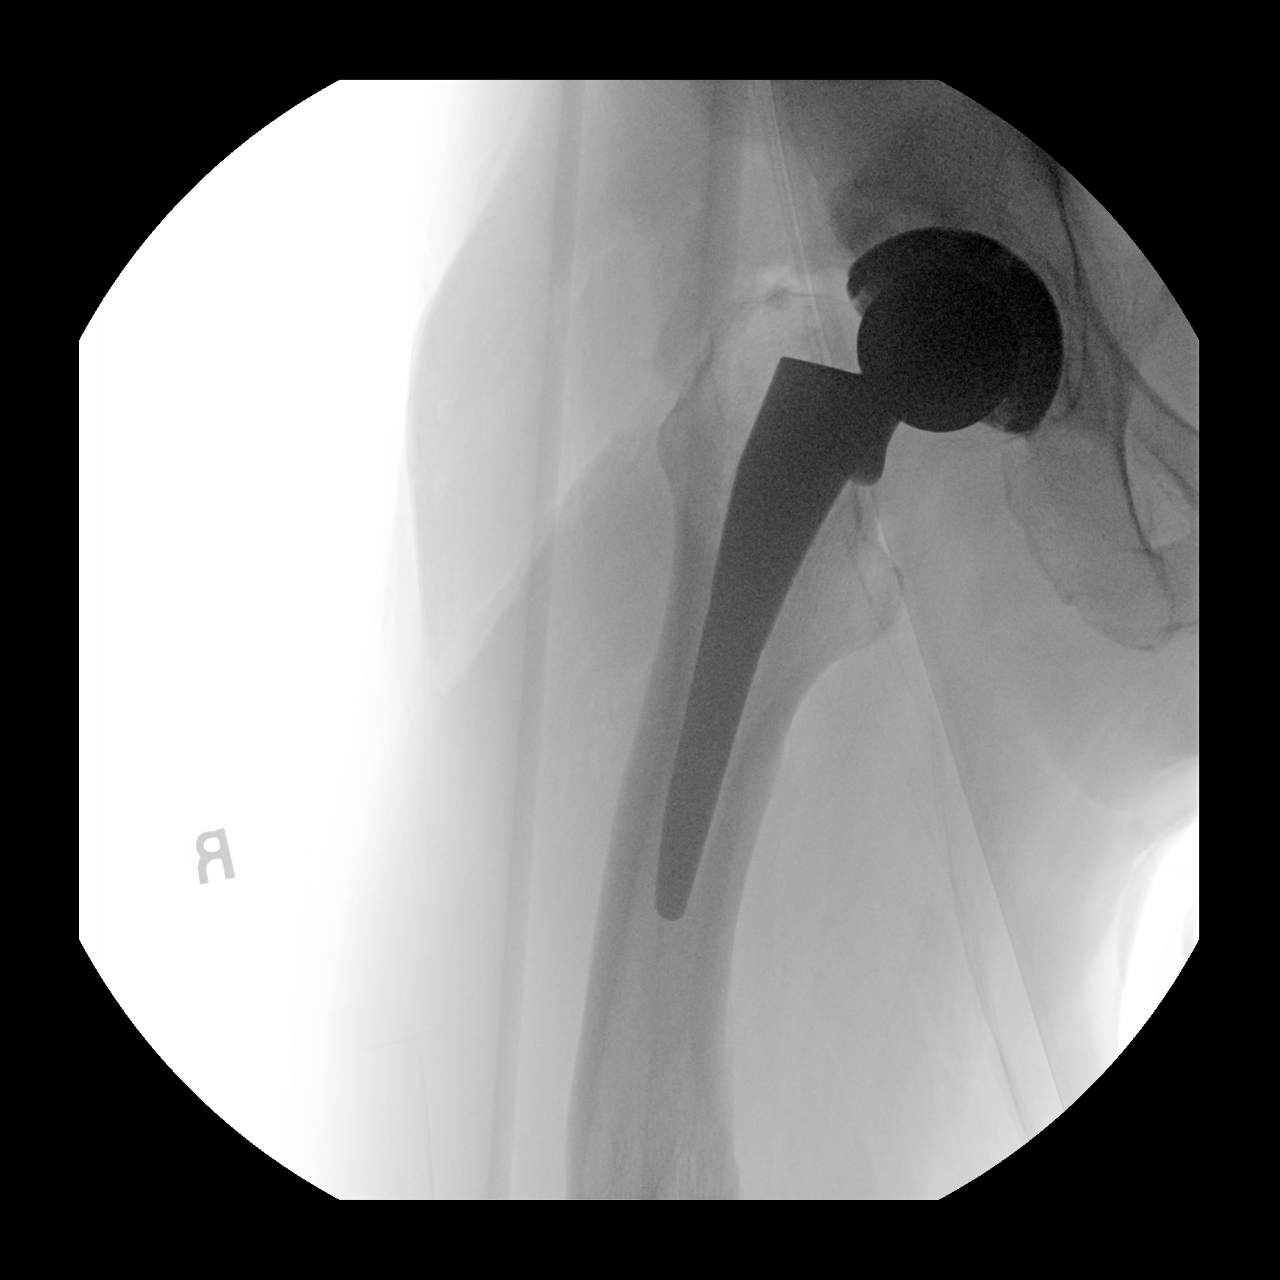
[im 3/3]
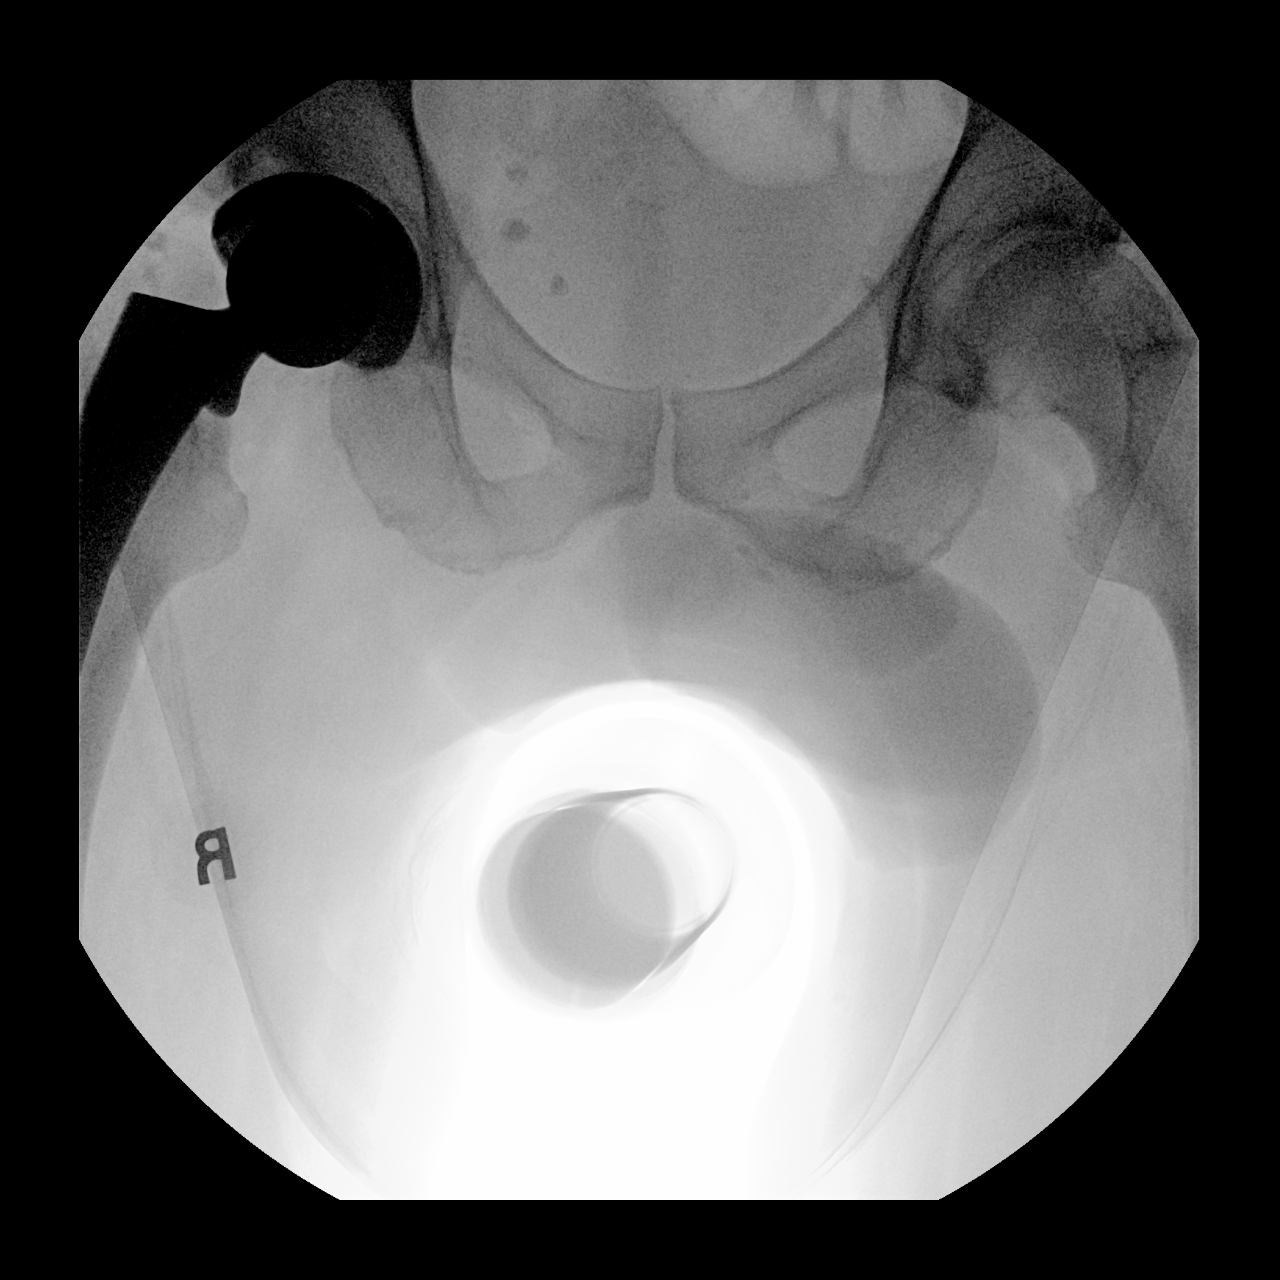

[3 of 3 positions shown; findings below may reference images not displayed]

FINDINGS: Three intraoperative fluoroscopic images of the right hip are
submitted. On the provided images, there are findings of interval
right total hip arthroplasty. The femoral and acetabular components
appear well seated. No unexpected finding on the provided views.
IMPRESSION: Three intraoperative fluoroscopic images from right total hip
arthroplasty, as described.

## 2022-05-07 ENCOUNTER — Other Ambulatory Visit: Payer: Self-pay | Admitting: Endocrinology

## 2022-05-07 ENCOUNTER — Other Ambulatory Visit (INDEPENDENT_AMBULATORY_CARE_PROVIDER_SITE_OTHER): Payer: 59

## 2022-05-07 DIAGNOSIS — E119 Type 2 diabetes mellitus without complications: Secondary | ICD-10-CM

## 2022-05-07 NOTE — Addendum Note (Signed)
Addended by: Vanessa Barbara D on: 05/07/2022 04:01 PM   Modules accepted: Orders

## 2022-05-09 LAB — LIPID PANEL
Chol/HDL Ratio: 4.8 ratio (ref 0.0–5.0)
Cholesterol, Total: 266 mg/dL — ABNORMAL HIGH (ref 100–199)
HDL: 56 mg/dL (ref >39)
LDL Chol Calc (NIH): 174 mg/dL — ABNORMAL HIGH (ref 0–99)
Triglycerides: 192 mg/dL — ABNORMAL HIGH (ref 0–149)
VLDL Cholesterol Cal: 36 mg/dL (ref 5–40)

## 2022-05-09 LAB — COMPREHENSIVE METABOLIC PANEL
ALT: 35 IU/L (ref 0–44)
AST: 20 IU/L (ref 0–40)
Albumin/Globulin Ratio: 1.7 (ref 1.2–2.2)
Albumin: 4.7 g/dL (ref 3.8–4.9)
Alkaline Phosphatase: 71 IU/L (ref 44–121)
BUN/Creatinine Ratio: 16 (ref 9–20)
BUN: 15 mg/dL (ref 6–24)
Bilirubin Total: 0.4 mg/dL (ref 0.0–1.2)
CO2: 21 mmol/L (ref 20–29)
Calcium: 10 mg/dL (ref 8.7–10.2)
Chloride: 103 mmol/L (ref 96–106)
Creatinine, Ser: 0.94 mg/dL (ref 0.76–1.27)
Globulin, Total: 2.7 g/dL (ref 1.5–4.5)
Glucose: 97 mg/dL (ref 70–99)
Potassium: 4.2 mmol/L (ref 3.5–5.2)
Sodium: 144 mmol/L (ref 134–144)
Total Protein: 7.4 g/dL (ref 6.0–8.5)
eGFR: 95 mL/min/{1.73_m2} (ref >59)

## 2022-05-09 LAB — HEMOGLOBIN A1C
Est. average glucose Bld gHb Est-mCnc: 146 mg/dL
Hgb A1c MFr Bld: 6.7 % — ABNORMAL HIGH (ref 4.8–5.6)

## 2022-05-09 LAB — MICROALBUMIN / CREATININE URINE RATIO
Creatinine, Urine: 131.3 mg/dL
Microalb/Creat Ratio: 7 mg/g creat (ref 0–29)
Microalbumin, Urine: 9.1 ug/mL
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
## 2022-05-13 ENCOUNTER — Ambulatory Visit (INDEPENDENT_AMBULATORY_CARE_PROVIDER_SITE_OTHER): Payer: 59 | Admitting: Endocrinology

## 2022-05-13 ENCOUNTER — Encounter: Payer: Self-pay | Admitting: Endocrinology

## 2022-05-13 VITALS — BP 122/72 | Ht 65.0 in | Wt 208.0 lb

## 2022-05-13 DIAGNOSIS — E782 Mixed hyperlipidemia: Secondary | ICD-10-CM | POA: Diagnosis not present

## 2022-05-13 DIAGNOSIS — E119 Type 2 diabetes mellitus without complications: Secondary | ICD-10-CM

## 2022-05-13 DIAGNOSIS — N529 Male erectile dysfunction, unspecified: Secondary | ICD-10-CM

## 2022-05-13 MED ORDER — SILDENAFIL CITRATE 50 MG PO TABS: 50.0000 mg | ORAL_TABLET | Freq: Every day | ORAL | 0 refills | Status: DC | PRN

## 2022-05-13 NOTE — Patient Instructions (Addendum)
Check blood sugars on waking up 3 days a week  Also check blood sugars about 2 hours after meals and do this after different meals by rotation  Recommended blood sugar levels on waking up are 90-120 and about 2 hours after meal is 130-160  Please bring your blood sugar monitor to each visit, thank you

## 2022-05-27 ENCOUNTER — Encounter (HOSPITAL_BASED_OUTPATIENT_CLINIC_OR_DEPARTMENT_OTHER): Payer: Self-pay

## 2022-05-27 ENCOUNTER — Other Ambulatory Visit: Payer: Self-pay

## 2022-05-27 ENCOUNTER — Emergency Department (HOSPITAL_BASED_OUTPATIENT_CLINIC_OR_DEPARTMENT_OTHER)
Admission: EM | Admit: 2022-05-27 | Discharge: 2022-05-27 | Disposition: A | Discharge status: Home / Self Care | Payer: No Typology Code available for payment source | Attending: Emergency Medicine | Admitting: Emergency Medicine

## 2022-05-27 DIAGNOSIS — H5789 Other specified disorders of eye and adnexa: Secondary | ICD-10-CM | POA: Insufficient documentation

## 2022-05-27 DIAGNOSIS — T6591XA Toxic effect of unspecified substance, accidental (unintentional), initial encounter: Secondary | ICD-10-CM | POA: Insufficient documentation

## 2022-05-27 DIAGNOSIS — Z7982 Long term (current) use of aspirin: Secondary | ICD-10-CM | POA: Diagnosis not present

## 2022-05-27 DIAGNOSIS — Z77098 Contact with and (suspected) exposure to other hazardous, chiefly nonmedicinal, chemicals: Secondary | ICD-10-CM

## 2022-05-27 MED ORDER — FLUORESCEIN SODIUM 1 MG OP STRP
1.0000 | ORAL_STRIP | Freq: Once | OPHTHALMIC | Status: AC
  Administered 2022-05-27: 1 via OPHTHALMIC
  Filled 2022-05-27: qty 1

## 2022-05-27 MED ORDER — TETRACAINE HCL 0.5 % OP SOLN
2.0000 [drp] | Freq: Once | OPHTHALMIC | Status: AC
  Administered 2022-05-27: 2 [drp] via OPHTHALMIC
  Filled 2022-05-27: qty 4
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
## 2022-05-27 NOTE — ED Notes (Signed)
Informed Dr. Tomi Bamberger of pt's visual acuity results.

## 2022-05-27 NOTE — ED Provider Notes (Signed)
[facility] EMERGENCY DEPT Provider Note   CSN: [id number] Arrival date & time: 05/27/22  6962     History  Chief Complaint  Patient presents with   Eye Pain    Jackson Stewart is a 56 y.o. male.   Eye Pain     Patient was at work on Friday when he accidentally had some tired be [place] into his face.  He was wearing protective glasses but thinks some got into his left eye.  Patient flushed out his eye while he was at work.  He started experiencing persistent left eye irritation he went to the nurse at work on Monday.  His eyes were irrigated and he was given drops.  Patient states his symptoms have persisted.  He also feels like he has gotten some debris out of his eye.  He continues to have redness so he came to the ED today. Home Medications Prior to Admission medications   Medication Sig Start Date End Date Taking? Authorizing Provider  allopurinol (ZYLOPRIM) 300 MG tablet Take 300 mg by mouth daily.    [provider]  ASHWAGANDHA PO Take 1 capsule by mouth daily.    [provider]  aspirin 81 MG chewable tablet Chew 1 tablet (81 mg total) by mouth 2 (two) times daily. 01/21/21   Mcarthur Rossetti, MD  b complex vitamins capsule Take 1 capsule by mouth daily.    [provider]  colchicine 0.6 MG tablet Take 0.6 mg by mouth daily as needed (gout).    [provider]  empagliflozin (JARDIANCE) 10 MG TABS tablet Take 1 tablet (10 mg total) by mouth daily before breakfast. 10/22/21   Renato Shin, MD  Omega-3 Fatty Acids (FISH OIL) 1000 MG CAPS Take 1,000 mg by mouth daily.    [provider]  rosuvastatin (CRESTOR) 20 MG tablet Take 20 mg by mouth daily.    [provider]  sildenafil (VIAGRA) 50 MG tablet Take 1 tablet (50 mg total) by mouth daily as needed for erectile dysfunction. 05/13/22   Elayne Snare, MD      Allergies    Patient has no known allergies.    Review of Systems   Review of Systems   Eyes:  Positive for pain.    Physical Exam Updated Vital Signs BP 138/88 (BP Location: Right Arm)   Pulse 87   Temp 97.9 F (36.6 C) (Oral)   Resp 16   Ht 1.651 m ('5\' 5"'$ )   Wt 93.4 kg   SpO2 98%   BMI 34.28 kg/m  Physical Exam Vitals and nursing note reviewed.  Constitutional:      General: He is not in acute distress.    Appearance: He is well-developed.  HENT:     Head: Normocephalic and atraumatic.     Right Ear: External ear normal.     Left Ear: External ear normal.  Eyes:     General: No scleral icterus.       Right eye: No discharge.        Left eye: No discharge.     Conjunctiva/sclera: Conjunctivae normal.     Pupils:     Left eye: No corneal abrasion or fluorescein uptake.     Comments: Visual acuity normal, no debris noted with lid eversion  Neck:     Trachea: No tracheal deviation.  Cardiovascular:     Rate and Rhythm: Normal rate.  Pulmonary:     Effort: Pulmonary effort is normal. No respiratory distress.  Breath sounds: No stridor.  Abdominal:     General: There is no distension.  Musculoskeletal:        General: No swelling or deformity.     Cervical back: Neck supple.  Skin:    General: Skin is warm and dry.     Findings: No rash.  Neurological:     Mental Status: He is alert.     Cranial Nerves: Cranial nerve deficit: no gross deficits.     ED Results / Procedures / Treatments   Labs (all labs ordered are listed, but only abnormal results are displayed) Labs Reviewed - No data to display  EKG None  Radiology No results found.  Procedures Procedures    Medications Ordered in ED Medications  fluorescein ophthalmic strip 1 strip (1 strip Left Eye Given 05/27/22 1031)  tetracaine (PONTOCAINE) 0.5 % ophthalmic solution 2 drop (2 drops Left Eye Given 05/27/22 1031)    ED Course/ Medical Decision Making/ A&P Clinical Course as of 05/27/22 1121  Thu May 27, 2022  1117 Case discussed with Dr. Romeo Apple office.  They will see the  patient in the office today at 1 PM [JK]    Clinical Course User Index [JK] Dorie Rank, MD                           Medical Decision Making Problems Addressed: Chemical exposure of eye: acute illness or injury that poses a threat to life or bodily functions  Amount and/or Complexity of Data Reviewed Discussion of management or test interpretation with external provider(s): Dr Romeo Apple office  Risk Prescription drug management.   Patient presents with persistent eye irritation after chemical exposure to the eye.  Initial injury occurred on Friday.  No signs of corneal ulceration.  The acuity is normal.  I irrigated no signs of any persistent debris.  However with his persistent eye irritation and erythema I have consulted with ophthalmology.  Dr. Valetta Close will see the patient in his office today at 1 PM for further evaluation        Final Clinical Impression(s) / ED Diagnoses Final diagnoses:  Chemical exposure of eye    Rx / DC Orders ED Discharge Orders     None         Dorie Rank, MD 05/27/22 1121

## 2022-05-27 NOTE — ED Notes (Signed)
Eye irrigated. Discharge instructions to go to ophthalmology at 1pm was explained, pt verbalized understanding. Pt had no further questions on d/c. Pt caox4 and ambulatory.

## 2022-06-27 ENCOUNTER — Emergency Department (HOSPITAL_COMMUNITY): Payer: 59

## 2022-06-27 ENCOUNTER — Emergency Department (HOSPITAL_COMMUNITY)
Admission: EM | Admit: 2022-06-27 | Discharge: 2022-06-27 | Disposition: A | Discharge status: Home / Self Care | Payer: 59 | Attending: Emergency Medicine | Admitting: Emergency Medicine

## 2022-06-27 ENCOUNTER — Encounter (HOSPITAL_COMMUNITY): Payer: Self-pay

## 2022-06-27 ENCOUNTER — Other Ambulatory Visit: Payer: Self-pay

## 2022-06-27 DIAGNOSIS — W268XXA Contact with other sharp object(s), not elsewhere classified, initial encounter: Secondary | ICD-10-CM | POA: Diagnosis not present

## 2022-06-27 DIAGNOSIS — Z7982 Long term (current) use of aspirin: Secondary | ICD-10-CM | POA: Diagnosis not present

## 2022-06-27 DIAGNOSIS — Z96641 Presence of right artificial hip joint: Secondary | ICD-10-CM | POA: Insufficient documentation

## 2022-06-27 DIAGNOSIS — Z23 Encounter for immunization: Secondary | ICD-10-CM | POA: Diagnosis not present

## 2022-06-27 DIAGNOSIS — E119 Type 2 diabetes mellitus without complications: Secondary | ICD-10-CM | POA: Diagnosis not present

## 2022-06-27 DIAGNOSIS — S6992XA Unspecified injury of left wrist, hand and finger(s), initial encounter: Secondary | ICD-10-CM | POA: Diagnosis present

## 2022-06-27 DIAGNOSIS — S61221A Laceration with foreign body of left index finger without damage to nail, initial encounter: Secondary | ICD-10-CM | POA: Insufficient documentation

## 2022-06-27 MED ORDER — ACETAMINOPHEN 500 MG PO TABS
1000.0000 mg | ORAL_TABLET | Freq: Once | ORAL | Status: AC
  Administered 2022-06-27: 1000 mg via ORAL
  Filled 2022-06-27: qty 2

## 2022-06-27 MED ORDER — CEPHALEXIN 500 MG PO CAPS
500.0000 mg | ORAL_CAPSULE | Freq: Once | ORAL | Status: AC
  Administered 2022-06-27: 500 mg via ORAL
  Filled 2022-06-27: qty 1

## 2022-06-27 MED ORDER — TETANUS-DIPHTH-ACELL PERTUSSIS 5-2.5-18.5 LF-MCG/0.5 IM SUSY
0.5000 mL | PREFILLED_SYRINGE | Freq: Once | INTRAMUSCULAR | Status: AC
  Administered 2022-06-27: 0.5 mL via INTRAMUSCULAR
  Filled 2022-06-27: qty 0.5

## 2022-06-27 MED ORDER — BACITRACIN ZINC 500 UNIT/GM EX OINT: 1.0000 | TOPICAL_OINTMENT | Freq: Two times a day (BID) | CUTANEOUS | 0 refills | Status: DC

## 2022-06-27 MED ORDER — CEPHALEXIN 500 MG PO CAPS: 500.0000 mg | ORAL_CAPSULE | Freq: Four times a day (QID) | ORAL | 0 refills | Status: DC

## 2022-06-27 MED ORDER — BUPIVACAINE HCL (PF) 0.5 % IJ SOLN
10.0000 mL | Freq: Once | INTRAMUSCULAR | Status: AC
  Administered 2022-06-27: 10 mL
  Filled 2022-06-27: qty 30

## 2022-06-27 MED ORDER — BACITRACIN ZINC 500 UNIT/GM EX OINT
TOPICAL_OINTMENT | Freq: Every day | CUTANEOUS | Status: DC
  Administered 2022-06-27: 1 via TOPICAL
  Filled 2022-06-27: qty 0.9
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
## 2022-06-27 NOTE — ED Triage Notes (Signed)
Pt cut his pointer finger working on a car this morning. Bleeding controlled in triage.

## 2022-06-27 NOTE — Discharge Instructions (Signed)
We evaluated you for your laceration.  We were able to evaluate the whole wound, x-ray did not show any fractures.  Your tendons appeared intact.  We did not see any nerve damage.  You did have multiple small metallic objects in the wound, we cleaned this extensively but it is possible there were microscopic objects we could not see.  We have started you on antibiotics.  Please take this 4 times a day for 1 week.  We have also given you antibiotic ointment to apply to the wound.  Please keep a close eye on your wound.  If you develop any redness, swelling, increasing pain, drainage of pus, trouble moving your finger, or any other concerning symptoms, please return to the emergency department.  Otherwise, please follow-up with your primary doctor in 7 to 10 days for suture removal.

## 2022-09-20 ENCOUNTER — Encounter: Payer: Self-pay | Admitting: Endocrinology

## 2022-09-20 ENCOUNTER — Ambulatory Visit: Payer: 59 | Admitting: Endocrinology

## 2022-09-20 VITALS — BP 130/84 | HR 85 | Ht 66.0 in | Wt 215.8 lb

## 2022-09-20 DIAGNOSIS — E782 Mixed hyperlipidemia: Secondary | ICD-10-CM | POA: Diagnosis not present

## 2022-09-20 DIAGNOSIS — E119 Type 2 diabetes mellitus without complications: Secondary | ICD-10-CM | POA: Diagnosis not present

## 2022-09-20 LAB — POCT GLYCOSYLATED HEMOGLOBIN (HGB A1C): Hemoglobin A1C: 6.9 % — AB (ref 4.0–5.6)

## 2022-09-20 LAB — POCT GLUCOSE (DEVICE FOR HOME USE): POC Glucose: 120 mg/dl — AB (ref 70–99)

## 2022-09-20 NOTE — Patient Instructions (Addendum)
Check blood sugars on waking up 3/7 days  Also check blood sugars about 2 hours after meals and do this after different meals by rotation  Recommended blood sugar levels on waking up are 90-130 and about 2 hours after meal is 130-160  Please bring your blood sugar monitor to each visit, thank you  Exercise 30-40 min at least 3/7
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
## 2022-11-12 ENCOUNTER — Other Ambulatory Visit: Payer: Self-pay

## 2022-11-12 DIAGNOSIS — E119 Type 2 diabetes mellitus without complications: Secondary | ICD-10-CM

## 2022-11-12 MED ORDER — EMPAGLIFLOZIN 10 MG PO TABS: 10.0000 mg | ORAL_TABLET | Freq: Every day | ORAL | 3 refills | Status: DC

## 2023-01-14 ENCOUNTER — Telehealth: Payer: Self-pay

## 2023-01-14 DIAGNOSIS — E119 Type 2 diabetes mellitus without complications: Secondary | ICD-10-CM

## 2023-01-14 NOTE — Telephone Encounter (Signed)
Orders Placed This Encounter  Procedures   Comprehensive metabolic panel    Standing Status:   Future    Standing Expiration Date:   07/17/2023   Microalbumin / creatinine urine ratio    Standing Status:   Future    Standing Expiration Date:   07/17/2023   Lipid panel    Standing Status:   Future    Standing Expiration Date:   07/17/2023   Hemoglobin A1c    Standing Status:   Future    Standing Expiration Date:   07/17/2023

## 2023-01-17 ENCOUNTER — Other Ambulatory Visit (INDEPENDENT_AMBULATORY_CARE_PROVIDER_SITE_OTHER): Payer: 59

## 2023-01-17 DIAGNOSIS — E119 Type 2 diabetes mellitus without complications: Secondary | ICD-10-CM | POA: Diagnosis not present

## 2023-01-18 LAB — COMPREHENSIVE METABOLIC PANEL
ALT: 27 U/L (ref 0–53)
AST: 30 U/L (ref 0–37)
Albumin: 4.6 g/dL (ref 3.5–5.2)
Alkaline Phosphatase: 62 U/L (ref 39–117)
BUN: 19 mg/dL (ref 6–23)
CO2: 27 mEq/L (ref 19–32)
Calcium: 10.1 mg/dL (ref 8.4–10.5)
Chloride: 100 mEq/L (ref 96–112)
Creatinine, Ser: 1.13 mg/dL (ref 0.40–1.50)
GFR: 72.49 mL/min (ref >60.00)
Glucose, Bld: 104 mg/dL — ABNORMAL HIGH (ref 70–99)
Potassium: 4.2 mEq/L (ref 3.5–5.1)
Sodium: 137 mEq/L (ref 135–145)
Total Bilirubin: 0.5 mg/dL (ref 0.2–1.2)
Total Protein: 8 g/dL (ref 6.0–8.3)

## 2023-01-18 LAB — LIPID PANEL
Cholesterol: 261 mg/dL — ABNORMAL HIGH (ref 0–200)
HDL: 50.1 mg/dL (ref >39.00)
NonHDL: 210.52
Total CHOL/HDL Ratio: 5
Triglycerides: 275 mg/dL — ABNORMAL HIGH (ref 0.0–149.0)
VLDL: 55 mg/dL — ABNORMAL HIGH (ref 0.0–40.0)

## 2023-01-18 LAB — LDL CHOLESTEROL, DIRECT: Direct LDL: 176 mg/dL

## 2023-01-18 LAB — HEMOGLOBIN A1C: Hgb A1c MFr Bld: 7.4 % — ABNORMAL HIGH (ref 4.6–6.5)

## 2023-01-19 ENCOUNTER — Ambulatory Visit: Payer: 59 | Admitting: "Endocrinology

## 2023-01-19 ENCOUNTER — Encounter: Payer: Self-pay | Admitting: "Endocrinology

## 2023-01-19 VITALS — BP 120/80 | HR 95 | Ht 66.0 in | Wt 215.8 lb

## 2023-01-19 DIAGNOSIS — Z7984 Long term (current) use of oral hypoglycemic drugs: Secondary | ICD-10-CM | POA: Diagnosis not present

## 2023-01-19 DIAGNOSIS — E782 Mixed hyperlipidemia: Secondary | ICD-10-CM

## 2023-01-19 DIAGNOSIS — E1165 Type 2 diabetes mellitus with hyperglycemia: Secondary | ICD-10-CM

## 2023-01-19 LAB — MICROALBUMIN / CREATININE URINE RATIO
Creatinine,U: 83.6 mg/dL
Microalb Creat Ratio: 0.8 mg/g (ref 0.0–30.0)
Microalb, Ur: <0.7 mg/dL (ref 0.0–1.9)

## 2023-01-19 MED ORDER — EMPAGLIFLOZIN 25 MG PO TABS: 25.0000 mg | ORAL_TABLET | Freq: Every day | ORAL | 0 refills | Status: DC

## 2023-01-19 MED ORDER — ROSUVASTATIN CALCIUM 5 MG PO TABS: 5.0000 mg | ORAL_TABLET | Freq: Every day | ORAL | 2 refills | Status: DC

## 2023-01-19 NOTE — Patient Instructions (Signed)
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
## 2023-05-13 ENCOUNTER — Other Ambulatory Visit: Payer: 59

## 2023-05-19 ENCOUNTER — Ambulatory Visit: Payer: 59 | Admitting: "Endocrinology

## 2023-05-26 ENCOUNTER — Other Ambulatory Visit (INDEPENDENT_AMBULATORY_CARE_PROVIDER_SITE_OTHER): Payer: 59

## 2023-05-26 DIAGNOSIS — E1165 Type 2 diabetes mellitus with hyperglycemia: Secondary | ICD-10-CM

## 2023-05-27 ENCOUNTER — Other Ambulatory Visit: Payer: Self-pay | Admitting: "Endocrinology

## 2023-05-27 DIAGNOSIS — E1165 Type 2 diabetes mellitus with hyperglycemia: Secondary | ICD-10-CM

## 2023-05-27 LAB — LIPID PANEL
Cholesterol: 271 mg/dL — ABNORMAL HIGH (ref <200)
HDL: 49 mg/dL (ref >=40)
LDL Cholesterol (Calc): 174 mg/dL (calc) — ABNORMAL HIGH
Non-HDL Cholesterol (Calc): 222 mg/dL (calc) — ABNORMAL HIGH (ref <130)
Total CHOL/HDL Ratio: 5.5 (calc) — ABNORMAL HIGH (ref <5.0)
Triglycerides: 267 mg/dL — ABNORMAL HIGH (ref <150)

## 2023-06-02 ENCOUNTER — Other Ambulatory Visit: Payer: Self-pay

## 2023-06-02 ENCOUNTER — Ambulatory Visit: Payer: 59 | Admitting: "Endocrinology

## 2023-06-02 ENCOUNTER — Other Ambulatory Visit (INDEPENDENT_AMBULATORY_CARE_PROVIDER_SITE_OTHER): Payer: 59

## 2023-06-02 ENCOUNTER — Encounter: Payer: Self-pay | Admitting: "Endocrinology

## 2023-06-02 VITALS — BP 130/83 | HR 88 | Ht 66.0 in | Wt 211.8 lb

## 2023-06-02 DIAGNOSIS — Z7984 Long term (current) use of oral hypoglycemic drugs: Secondary | ICD-10-CM

## 2023-06-02 DIAGNOSIS — E11649 Type 2 diabetes mellitus with hypoglycemia without coma: Secondary | ICD-10-CM

## 2023-06-02 DIAGNOSIS — E1165 Type 2 diabetes mellitus with hyperglycemia: Secondary | ICD-10-CM

## 2023-06-02 DIAGNOSIS — E782 Mixed hyperlipidemia: Secondary | ICD-10-CM

## 2023-06-02 DIAGNOSIS — Z794 Long term (current) use of insulin: Secondary | ICD-10-CM

## 2023-06-02 LAB — POCT GLYCOSYLATED HEMOGLOBIN (HGB A1C)
Hemoglobin A1C: 7.4 % — AB (ref 4.0–5.6)
Hemoglobin A1C: 7.4 % — AB (ref 4.0–5.6)

## 2023-06-02 MED ORDER — PRAVASTATIN SODIUM 10 MG PO TABS: 10.0000 mg | ORAL_TABLET | Freq: Every day | ORAL | 1 refills | Status: DC
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
## 2023-06-02 NOTE — Patient Instructions (Signed)

## 2023-08-17 ENCOUNTER — Other Ambulatory Visit: Payer: Self-pay | Admitting: "Endocrinology

## 2023-08-17 DIAGNOSIS — E1165 Type 2 diabetes mellitus with hyperglycemia: Secondary | ICD-10-CM

## 2023-08-19 NOTE — Telephone Encounter (Signed)
Jardiance refill request complete,

## 2023-08-25 ENCOUNTER — Encounter: Payer: Self-pay | Admitting: "Endocrinology

## 2023-08-25 ENCOUNTER — Ambulatory Visit: Payer: 59 | Admitting: "Endocrinology

## 2023-08-25 VITALS — BP 132/84 | HR 84 | Resp 20 | Ht 67.0 in | Wt 212.6 lb

## 2023-08-25 DIAGNOSIS — E1165 Type 2 diabetes mellitus with hyperglycemia: Secondary | ICD-10-CM | POA: Diagnosis not present

## 2023-08-25 DIAGNOSIS — E782 Mixed hyperlipidemia: Secondary | ICD-10-CM | POA: Diagnosis not present

## 2023-08-25 DIAGNOSIS — Z7984 Long term (current) use of oral hypoglycemic drugs: Secondary | ICD-10-CM

## 2023-08-25 LAB — POCT GLYCOSYLATED HEMOGLOBIN (HGB A1C): Hemoglobin A1C: 7 % — AB (ref 4.0–5.6)

## 2023-08-25 MED ORDER — SYNJARDY 12.5-1000 MG PO TABS: 1.0000 | ORAL_TABLET | Freq: Two times a day (BID) | ORAL | 3 refills | Status: DC

## 2023-08-25 NOTE — Patient Instructions (Signed)
Will recommend for the following change of medications to: Stop Jardiance 25 mg every day Start synjardy 12.01/999 mg metformin once a day, if well tolerated start it twice a day
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
## 2023-10-25 ENCOUNTER — Ambulatory Visit: Payer: 59 | Admitting: "Endocrinology

## 2023-11-26 LAB — AMB RESULTS CONSOLE CBG: Glucose: 158

## 2023-11-26 LAB — HEMOGLOBIN A1C: Hemoglobin A1C: 7.2

## 2023-11-26 NOTE — Progress Notes (Signed)
 Pt screened for non fasting blood sugar and A1C. Pt states he is pre-diabetic. Educated on DM and healthy eating habits. Good sugars vs bad sugars. He has follow up scheduled with PCP in next month. Encouraged to keep appointment

## 2023-11-29 ENCOUNTER — Other Ambulatory Visit: Payer: Self-pay | Admitting: "Endocrinology

## 2023-11-29 DIAGNOSIS — E1165 Type 2 diabetes mellitus with hyperglycemia: Secondary | ICD-10-CM

## 2023-12-12 ENCOUNTER — Other Ambulatory Visit: Payer: Self-pay

## 2023-12-12 ENCOUNTER — Telehealth: Payer: Self-pay | Admitting: "Endocrinology

## 2023-12-12 DIAGNOSIS — E1165 Type 2 diabetes mellitus with hyperglycemia: Secondary | ICD-10-CM

## 2023-12-12 MED ORDER — SYNJARDY 12.5-1000 MG PO TABS: 1.0000 | ORAL_TABLET | Freq: Two times a day (BID) | ORAL | 3 refills | Status: DC

## 2023-12-12 NOTE — Telephone Encounter (Signed)
 MEDICINE: Synjardy Empagliflozin-metFORMIN HCl (SYNJARDY) 12.01-999 MG TABS  PHARMACY:    [facility] [address], [place] - [address] [place] (Ph: [phone])    HAS THE PATIENT CONTACTED THEIR PHARMACY?  Yes  IS THIS A 90 DAY SUPPLY : Yes  IS PATIENT OUT OF MEDICATION: Yes  IF NOT; HOW MUCH IS LEFT:   LAST APPOINTMENT DATE: @12 /19/2024  NEXT APPOINTMENT DATE:@4 /07/2024  DO WE HAVE YOUR PERMISSION TO LEAVE A DETAILED MESSAGE?:Yes  OTHER COMMENTS:    **Let patient know to contact pharmacy at the end of the day to make sure medication is ready. **  ** Please notify patient to allow 48-72 hours to process**  **Encourage patient to contact the pharmacy for refills or they can request refills through [facility]**

## 2023-12-16 ENCOUNTER — Telehealth: Payer: Self-pay | Admitting: "Endocrinology

## 2023-12-16 ENCOUNTER — Other Ambulatory Visit: Payer: Self-pay

## 2023-12-16 ENCOUNTER — Ambulatory Visit: Admitting: "Endocrinology

## 2023-12-16 ENCOUNTER — Encounter: Payer: Self-pay | Admitting: "Endocrinology

## 2023-12-16 VITALS — BP 130/80 | HR 67 | Ht 67.0 in | Wt 208.0 lb

## 2023-12-16 DIAGNOSIS — E1165 Type 2 diabetes mellitus with hyperglycemia: Secondary | ICD-10-CM

## 2023-12-16 DIAGNOSIS — E782 Mixed hyperlipidemia: Secondary | ICD-10-CM

## 2023-12-16 DIAGNOSIS — Z7984 Long term (current) use of oral hypoglycemic drugs: Secondary | ICD-10-CM

## 2023-12-16 MED ORDER — CONTOUR NEXT TEST VI STRP: ORAL_STRIP | 12 refills | Status: AC

## 2023-12-16 MED ORDER — BD SWAB SINGLE USE REGULAR PADS: 1.0000 | MEDICATED_PAD | Freq: Three times a day (TID) | 11 refills | Status: AC

## 2023-12-16 MED ORDER — LANCETS MISC: 1.0000 | Freq: Three times a day (TID) | 11 refills | Status: AC
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
## 2023-12-16 NOTE — Telephone Encounter (Signed)
 MEDICATION:  Contour Next Test glucose blood (CONTOUR NEXT TEST) test strip  PHARMACY:    [facility] [address] [place] - [address] [place] (Ph: [phone])    HAS THE PATIENT CONTACTED THEIR PHARMACY?  Yes  IS THIS A 90 DAY SUPPLY : Yes  IS PATIENT OUT OF MEDICATION: Yes  IF NOT; HOW MUCH IS LEFT:   LAST APPOINTMENT DATE: @4 /07/2024  NEXT APPOINTMENT DATE:@7 /07/2024  DO WE HAVE YOUR PERMISSION TO LEAVE A DETAILED MESSAGE?:Yes  OTHER COMMENTS:    **Let patient know to contact pharmacy at the end of the day to make sure medication is ready. **  ** Please notify patient to allow 48-72 hours to process**  **Encourage patient to contact the pharmacy for refills or they can request refills through [facility]

## 2023-12-18 ENCOUNTER — Encounter: Payer: Self-pay | Admitting: "Endocrinology

## 2023-12-19 ENCOUNTER — Other Ambulatory Visit: Payer: Self-pay | Admitting: "Endocrinology

## 2023-12-19 MED ORDER — JARDIANCE 25 MG PO TABS: 25.0000 mg | ORAL_TABLET | Freq: Every day | ORAL | 1 refills | Status: DC

## 2023-12-27 ENCOUNTER — Ambulatory Visit

## 2023-12-30 NOTE — Progress Notes (Signed)
 Pt attended 11/26/23 screening event with blood sugar of 158 and A!C was 7.2, pt is diabetic. Pt noted at event that he does have a PCP. At event pt did not indicate any SDOH needs. Pt also noted that he is not a smoker and listed Private as his insurance at the event.   Per chart review pt does have a PCP (Dr. Tesfaye Demissie Fanta), insurance, and is not a smoker. Pt's has an upcoming appt w/different PCP on 03/19/2024. Pt does not indicate any SDOH needs at this time.  No additional pt f/u to be scheduled at this time per health equity protocol.

## 2024-01-18 ENCOUNTER — Other Ambulatory Visit: Payer: Self-pay

## 2024-01-18 DIAGNOSIS — E1165 Type 2 diabetes mellitus with hyperglycemia: Secondary | ICD-10-CM

## 2024-01-18 MED ORDER — JARDIANCE 25 MG PO TABS: 25.0000 mg | ORAL_TABLET | Freq: Every day | ORAL | 1 refills | Status: AC

## 2024-01-18 NOTE — Telephone Encounter (Signed)
 Requested Prescriptions   Signed Prescriptions Disp Refills   JARDIANCE  25 MG TABS tablet 90 tablet 1    Sig: Take 1 tablet (25 mg total) by mouth daily.    Authorizing Provider: Jorge Newcomer    Ordering User: Waneta Gut

## 2024-01-23 ENCOUNTER — Telehealth: Payer: Self-pay

## 2024-01-23 ENCOUNTER — Other Ambulatory Visit (HOSPITAL_COMMUNITY): Payer: Self-pay

## 2024-01-23 ENCOUNTER — Other Ambulatory Visit: Payer: Self-pay

## 2024-01-23 NOTE — Telephone Encounter (Signed)
 Pt needs PA done for jardiance  25mg .Thanks

## 2024-01-23 NOTE — Telephone Encounter (Signed)
 Pharmacy Patient Advocate Encounter   Received notification from Pt Calls Messages that prior authorization for Jardiance  is required/requested.   Insurance verification completed.   The patient is insured through [facility] .   Per test claim: PA required; PA submitted to above mentioned insurance via CoverMyMeds Key/confirmation #/EOC [id number] Status is pending

## 2024-01-24 NOTE — Telephone Encounter (Signed)
 Pharmacy Patient Advocate Encounter  Received notification from [facility] that Prior Authorization for Jardiance  has been APPROVED from 01-23-2024 to 01-22-2025   PA #/Case ID/Reference #: [id number]

## 2024-02-08 DIAGNOSIS — R030 Elevated blood-pressure reading, without diagnosis of hypertension: Secondary | ICD-10-CM | POA: Diagnosis not present

## 2024-02-08 DIAGNOSIS — Z7984 Long term (current) use of oral hypoglycemic drugs: Secondary | ICD-10-CM | POA: Diagnosis not present

## 2024-02-08 DIAGNOSIS — E119 Type 2 diabetes mellitus without complications: Secondary | ICD-10-CM | POA: Diagnosis not present

## 2024-02-08 DIAGNOSIS — Z6831 Body mass index (BMI) 31.0-31.9, adult: Secondary | ICD-10-CM | POA: Diagnosis not present

## 2024-02-08 DIAGNOSIS — E669 Obesity, unspecified: Secondary | ICD-10-CM | POA: Diagnosis not present

## 2024-03-16 ENCOUNTER — Ambulatory Visit: Admitting: "Endocrinology

## 2024-03-19 ENCOUNTER — Ambulatory Visit

## 2024-03-19 VITALS — BP 114/74 | HR 96 | Ht 67.0 in | Wt 212.0 lb

## 2024-03-19 DIAGNOSIS — E119 Type 2 diabetes mellitus without complications: Secondary | ICD-10-CM

## 2024-03-19 DIAGNOSIS — Z7984 Long term (current) use of oral hypoglycemic drugs: Secondary | ICD-10-CM

## 2024-03-19 DIAGNOSIS — Z6833 Body mass index (BMI) 33.0-33.9, adult: Secondary | ICD-10-CM | POA: Diagnosis not present

## 2024-03-19 DIAGNOSIS — E66811 Obesity, class 1: Secondary | ICD-10-CM | POA: Diagnosis not present

## 2024-03-19 DIAGNOSIS — Z7689 Persons encountering health services in other specified circumstances: Secondary | ICD-10-CM
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
## 2024-03-20 DIAGNOSIS — E66811 Obesity, class 1: Secondary | ICD-10-CM | POA: Insufficient documentation

## 2024-03-20 NOTE — Assessment & Plan Note (Signed)
 This is being managed by endocrinology, Dr. [facility], for the past few years.  His most recent A1C was 7.2 (11/26/23).  He has a f/u appt with Dr. [facility] in one month.  He denies any concerns associated with diabetes at this time.  Recommend yearly diabetic eye exam.

## 2024-03-20 NOTE — Assessment & Plan Note (Signed)
 Recommend reducing weight with healthy diet and at least 150 minutes of exercise per week.

## 2024-04-30 ENCOUNTER — Ambulatory Visit: Admitting: "Endocrinology
